# Patient Record
Sex: Female | Born: 1975 | Hispanic: No | Marital: Married | Smoking: Never smoker
Health system: Southern US, Community
[De-identification: ages and names within clinical notes are randomized; demographics above are authoritative.]

## PROBLEM LIST (undated history)

## (undated) ENCOUNTER — Inpatient Hospital Stay (HOSPITAL_COMMUNITY): Payer: Self-pay

## (undated) DIAGNOSIS — Z789 Other specified health status: Secondary | ICD-10-CM

## (undated) DIAGNOSIS — J302 Other seasonal allergic rhinitis: Secondary | ICD-10-CM

## (undated) HISTORY — PX: NO PAST SURGERIES: SHX2092

## (undated) HISTORY — DX: Other seasonal allergic rhinitis: J30.2

---

## 2002-04-29 ENCOUNTER — Inpatient Hospital Stay (HOSPITAL_COMMUNITY): Admission: AD | Admit: 2002-04-29 | Discharge: 2002-04-29 | Payer: Self-pay | Admitting: Obstetrics and Gynecology

## 2002-06-09 ENCOUNTER — Other Ambulatory Visit: Admission: RE | Admit: 2002-06-09 | Discharge: 2002-06-09 | Payer: Self-pay | Admitting: Obstetrics and Gynecology

## 2002-11-18 ENCOUNTER — Encounter (HOSPITAL_COMMUNITY): Admission: RE | Admit: 2002-11-18 | Discharge: 2002-11-22 | Payer: Self-pay | Admitting: *Deleted

## 2002-11-25 ENCOUNTER — Inpatient Hospital Stay (HOSPITAL_COMMUNITY): Admission: AD | Admit: 2002-11-25 | Discharge: 2002-11-27 | Payer: Self-pay | Admitting: *Deleted

## 2002-11-28 ENCOUNTER — Encounter: Admission: RE | Admit: 2002-11-28 | Discharge: 2002-12-28 | Payer: Self-pay | Admitting: *Deleted

## 2012-07-29 ENCOUNTER — Other Ambulatory Visit (HOSPITAL_COMMUNITY): Payer: Self-pay | Admitting: Obstetrics

## 2012-07-29 DIAGNOSIS — O09529 Supervision of elderly multigravida, unspecified trimester: Secondary | ICD-10-CM

## 2012-07-29 DIAGNOSIS — Z3689 Encounter for other specified antenatal screening: Secondary | ICD-10-CM

## 2012-08-10 ENCOUNTER — Encounter (HOSPITAL_COMMUNITY): Payer: Self-pay | Admitting: Obstetrics

## 2012-08-10 LAB — OB RESULTS CONSOLE RUBELLA ANTIBODY, IGM: Rubella: IMMUNE

## 2012-08-10 LAB — OB RESULTS CONSOLE HIV ANTIBODY (ROUTINE TESTING): HIV: NONREACTIVE

## 2012-08-10 LAB — OB RESULTS CONSOLE GC/CHLAMYDIA: Chlamydia: NEGATIVE

## 2012-08-10 LAB — OB RESULTS CONSOLE RPR: RPR: NONREACTIVE

## 2012-08-10 LAB — OB RESULTS CONSOLE ANTIBODY SCREEN: Antibody Screen: NEGATIVE

## 2012-08-10 LAB — OB RESULTS CONSOLE HEPATITIS B SURFACE ANTIGEN: Hepatitis B Surface Ag: NEGATIVE

## 2012-08-17 ENCOUNTER — Ambulatory Visit (HOSPITAL_COMMUNITY): Payer: Self-pay

## 2012-08-24 ENCOUNTER — Ambulatory Visit (HOSPITAL_COMMUNITY): Admission: RE | Admit: 2012-08-24 | Payer: Self-pay | Source: Ambulatory Visit

## 2012-08-24 ENCOUNTER — Ambulatory Visit (HOSPITAL_COMMUNITY)
Admission: RE | Admit: 2012-08-24 | Discharge: 2012-08-24 | Disposition: A | Discharge status: Home / Self Care | Payer: Self-pay | Source: Ambulatory Visit | Attending: Obstetrics | Admitting: Obstetrics

## 2012-08-24 ENCOUNTER — Encounter (HOSPITAL_COMMUNITY): Payer: Self-pay

## 2012-08-24 DIAGNOSIS — Z3689 Encounter for other specified antenatal screening: Secondary | ICD-10-CM | POA: Insufficient documentation

## 2012-08-24 DIAGNOSIS — O09529 Supervision of elderly multigravida, unspecified trimester: Secondary | ICD-10-CM | POA: Insufficient documentation

## 2012-08-25 ENCOUNTER — Other Ambulatory Visit: Payer: Self-pay

## 2012-09-29 NOTE — L&D Delivery Note (Signed)
Delivery Note At 9:33 PM a viable female was delivered via Vaginal, Spontaneous Delivery (Presentation: ;  ).  APGAR: , ; weight .   Placenta status: , .  Cord:  with the following complications: .  Cord pH: not done  Anesthesia:   Episiotomy:  Lacerations:  Suture Repair: 2.0 Est. Blood Loss (mL):   Mom to postpartum.  Baby to nursery-stable.  Rabon Scholle A 01/05/2013, 9:46 PM

## 2012-10-30 ENCOUNTER — Emergency Department (HOSPITAL_COMMUNITY)
Admission: EM | Admit: 2012-10-30 | Discharge: 2012-10-30 | Disposition: A | Discharge status: Home / Self Care | Payer: No Typology Code available for payment source | Attending: Emergency Medicine | Admitting: Emergency Medicine

## 2012-10-30 ENCOUNTER — Encounter (HOSPITAL_COMMUNITY): Payer: Self-pay | Admitting: Emergency Medicine

## 2012-10-30 DIAGNOSIS — Z043 Encounter for examination and observation following other accident: Secondary | ICD-10-CM | POA: Insufficient documentation

## 2012-10-30 DIAGNOSIS — O9989 Other specified diseases and conditions complicating pregnancy, childbirth and the puerperium: Secondary | ICD-10-CM | POA: Insufficient documentation

## 2012-10-30 DIAGNOSIS — Y9389 Activity, other specified: Secondary | ICD-10-CM | POA: Insufficient documentation

## 2012-10-30 DIAGNOSIS — Y92410 Unspecified street and highway as the place of occurrence of the external cause: Secondary | ICD-10-CM | POA: Insufficient documentation

## 2012-10-30 DIAGNOSIS — Z3490 Encounter for supervision of normal pregnancy, unspecified, unspecified trimester: Secondary | ICD-10-CM

## 2012-10-30 MED ORDER — SODIUM CHLORIDE 0.9 % IV BOLUS (SEPSIS)
500.0000 mL | Freq: Once | INTRAVENOUS | Status: AC
  Administered 2012-10-30: 21:00:00 via INTRAVENOUS

## 2012-10-30 MED ORDER — SODIUM CHLORIDE 0.9 % IV SOLN
INTRAVENOUS | Status: DC
  Administered 2012-10-30: 21:00:00 via INTRAVENOUS
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
## 2012-12-09 ENCOUNTER — Inpatient Hospital Stay (HOSPITAL_COMMUNITY)
Admission: AD | Admit: 2012-12-09 | Discharge: 2012-12-10 | Disposition: A | Discharge status: Home / Self Care | Payer: Self-pay | Source: Ambulatory Visit | Attending: Obstetrics | Admitting: Obstetrics

## 2012-12-09 ENCOUNTER — Encounter (HOSPITAL_COMMUNITY): Payer: Self-pay | Admitting: *Deleted

## 2012-12-09 DIAGNOSIS — O4700 False labor before 37 completed weeks of gestation, unspecified trimester: Secondary | ICD-10-CM | POA: Insufficient documentation

## 2012-12-09 HISTORY — DX: Other specified health status: Z78.9

## 2012-12-09 NOTE — MAU Note (Signed)
Pt states she started having contractions in the morning, contractions seem to have become closer together, and they hurt a little more than they did this morning.

## 2012-12-09 NOTE — MAU Note (Signed)
Pt states has been having contracitons all evening-states baby is not moving much and wants to make sure everything is OK

## 2012-12-10 MED ORDER — LACTATED RINGERS IV BOLUS (SEPSIS)
1000.0000 mL | Freq: Once | INTRAVENOUS | Status: AC
  Administered 2012-12-10: 1000 mL via INTRAVENOUS

## 2012-12-10 MED ORDER — TERBUTALINE SULFATE 1 MG/ML IJ SOLN
0.2500 mg | Freq: Once | INTRAMUSCULAR | Status: AC
  Administered 2012-12-10: 0.25 mg via SUBCUTANEOUS
  Filled 2012-12-10: qty 1

## 2012-12-10 NOTE — MAU Provider Note (Signed)
  History     CSN: [id number]  Arrival date and time: 12/09/12 2221   First Provider Initiated Contact with Patient 12/10/12 0033      Chief Complaint  Patient presents with  . Contractions   HPI  Pt is [redacted]w[redacted]d G4P2013 pregnant and presents with contractions.  Pt states she has had ctx all day.  She denies leakage of fluid or bleeding..  Pt denies UTI symptoms, fever, chills, constipation or diarrhea.  Pt denies complications with this pregnancy or prior pregnancies  Past Medical History  Diagnosis Date  . Medical history non-contributory     Past Surgical History  Procedure Laterality Date  . No past surgeries      Family History  Problem Relation Age of Onset  . Alcohol abuse Neg Hx   . Arthritis Neg Hx   . Asthma Neg Hx   . Birth defects Neg Hx   . Cancer Neg Hx   . COPD Neg Hx   . Depression Neg Hx   . Diabetes Neg Hx   . Drug abuse Neg Hx   . Early death Neg Hx   . Hearing loss Neg Hx   . Heart disease Neg Hx   . Hyperlipidemia Neg Hx   . Hypertension Neg Hx   . Kidney disease Neg Hx   . Learning disabilities Neg Hx   . Mental illness Neg Hx   . Mental retardation Neg Hx   . Miscarriages / Stillbirths Neg Hx   . Stroke Neg Hx   . Vision loss Neg Hx     History  Substance Use Topics  . Smoking status: Never Smoker   . Smokeless tobacco: Not on file  . Alcohol Use: No    Allergies: No Known Allergies  Prescriptions prior to admission  Medication Sig Dispense Refill  . Prenatal Vit-Fe Fumarate-FA (PRENATAL MULTIVITAMIN) TABS Take 1 tablet by mouth daily.        Review of Systems  Constitutional: Negative for fever and chills.  Gastrointestinal: Positive for abdominal pain. Negative for nausea, vomiting, diarrhea and constipation.  Genitourinary: Negative for dysuria.  Neurological: Negative for headaches.   Physical Exam   Blood pressure 110/70, pulse 120, temperature 98.9 F (37.2 C), temperature source Oral, resp. rate 20, height 5\' 1"   (1.549 m), weight 174 lb (78.926 kg), last menstrual period 04/11/2012, SpO2 100.00%.  Physical Exam  Nursing note and vitals reviewed. Constitutional: She is oriented to person, place, and time. She appears well-developed and well-nourished. No distress.  HENT:  Head: Normocephalic.  Eyes: Pupils are equal, round, and reactive to light.  Neck: Normal range of motion. Neck supple.  Cardiovascular: Normal rate.   Respiratory: Effort normal.  GI:  Ctx every 2 to 4 minutes; FHR reactive  Genitourinary:  Cervix FT ~50% effaced, high  Musculoskeletal: Normal range of motion.  Neurological: She is alert and oriented to person, place, and time.  Skin: Skin is warm and dry.  Psychiatric: She has a normal mood and affect.    MAU Course  Procedures Ctx every 2 to 4 minutes- discussed with Dr. Gaynell Face 1000ccLR with Terbutaline0.25mg   SQ given with occ ctx FHR reactive Pt discharged home to F/U with Dr. Gaynell Face as scheduled- sooner if increase in symptoms  Assessment and Plan  Threatened preterm labor Pelvic rest until appt with Dr. Ellin Goodie 12/10/2012, 12:34 AM

## 2013-01-05 ENCOUNTER — Encounter (HOSPITAL_COMMUNITY): Payer: Self-pay

## 2013-01-05 ENCOUNTER — Inpatient Hospital Stay (HOSPITAL_COMMUNITY)
Admission: AD | Admit: 2013-01-05 | Discharge: 2013-01-07 | DRG: 775 | Disposition: A | Discharge status: Home / Self Care | Payer: Medicaid Other | Source: Ambulatory Visit | Attending: Obstetrics | Admitting: Obstetrics

## 2013-01-05 DIAGNOSIS — Z22330 Carrier of Group B streptococcus: Secondary | ICD-10-CM

## 2013-01-05 DIAGNOSIS — O99892 Other specified diseases and conditions complicating childbirth: Principal | ICD-10-CM | POA: Diagnosis present

## 2013-01-05 DIAGNOSIS — O09529 Supervision of elderly multigravida, unspecified trimester: Secondary | ICD-10-CM | POA: Diagnosis present

## 2013-01-05 LAB — TYPE AND SCREEN
ABO/RH(D): O POS
Antibody Screen: NEGATIVE

## 2013-01-05 LAB — CBC
Platelets: 188 10*3/uL (ref 150–400)
RBC: 3.7 MIL/uL — ABNORMAL LOW (ref 3.87–5.11)
RDW: 13.2 % (ref 11.5–15.5)
WBC: 9.8 10*3/uL (ref 4.0–10.5)

## 2013-01-05 MED ORDER — SENNOSIDES-DOCUSATE SODIUM 8.6-50 MG PO TABS
2.0000 | ORAL_TABLET | Freq: Every day | ORAL | Status: DC
  Administered 2013-01-06: 2 via ORAL

## 2013-01-05 MED ORDER — TETANUS-DIPHTH-ACELL PERTUSSIS 5-2.5-18.5 LF-MCG/0.5 IM SUSP
0.5000 mL | Freq: Once | INTRAMUSCULAR | Status: AC
  Administered 2013-01-06: 0.5 mL via INTRAMUSCULAR
  Filled 2013-01-05 (×2): qty 0.5

## 2013-01-05 MED ORDER — EPHEDRINE 5 MG/ML INJ
10.0000 mg | INTRAVENOUS | Status: DC | PRN
  Filled 2013-01-05: qty 2

## 2013-01-05 MED ORDER — IBUPROFEN 600 MG PO TABS
600.0000 mg | ORAL_TABLET | Freq: Four times a day (QID) | ORAL | Status: DC | PRN
  Administered 2013-01-06: 600 mg via ORAL
  Filled 2013-01-05 (×7): qty 1

## 2013-01-05 MED ORDER — OXYTOCIN BOLUS FROM INFUSION
500.0000 mL | INTRAVENOUS | Status: DC
  Administered 2013-01-05: 500 mL via INTRAVENOUS

## 2013-01-05 MED ORDER — DIPHENHYDRAMINE HCL 25 MG PO CAPS: 25.0000 mg | ORAL_CAPSULE | Freq: Four times a day (QID) | ORAL | Status: DC | PRN

## 2013-01-05 MED ORDER — SIMETHICONE 80 MG PO CHEW: 80.0000 mg | CHEWABLE_TABLET | ORAL | Status: DC | PRN

## 2013-01-05 MED ORDER — ONDANSETRON HCL 4 MG/2ML IJ SOLN: 4.0000 mg | Freq: Four times a day (QID) | INTRAMUSCULAR | Status: DC | PRN

## 2013-01-05 MED ORDER — ONDANSETRON HCL 4 MG PO TABS: 4.0000 mg | ORAL_TABLET | ORAL | Status: DC | PRN

## 2013-01-05 MED ORDER — LACTATED RINGERS IV SOLN: 500.0000 mL | INTRAVENOUS | Status: DC | PRN

## 2013-01-05 MED ORDER — BENZOCAINE-MENTHOL 20-0.5 % EX AERO: 1.0000 "application " | INHALATION_SPRAY | CUTANEOUS | Status: DC | PRN

## 2013-01-05 MED ORDER — PRENATAL MULTIVITAMIN CH
1.0000 | ORAL_TABLET | Freq: Every day | ORAL | Status: DC
  Administered 2013-01-06 – 2013-01-07 (×2): 1 via ORAL
  Filled 2013-01-05: qty 1

## 2013-01-05 MED ORDER — FERROUS SULFATE 325 (65 FE) MG PO TABS
325.0000 mg | ORAL_TABLET | Freq: Two times a day (BID) | ORAL | Status: DC
  Administered 2013-01-06 – 2013-01-07 (×4): 325 mg via ORAL
  Filled 2013-01-05 (×4): qty 1

## 2013-01-05 MED ORDER — WITCH HAZEL-GLYCERIN EX PADS: 1.0000 "application " | MEDICATED_PAD | CUTANEOUS | Status: DC | PRN

## 2013-01-05 MED ORDER — DIPHENHYDRAMINE HCL 50 MG/ML IJ SOLN: 12.5000 mg | INTRAMUSCULAR | Status: DC | PRN

## 2013-01-05 MED ORDER — PHENYLEPHRINE 40 MCG/ML (10ML) SYRINGE FOR IV PUSH (FOR BLOOD PRESSURE SUPPORT)
80.0000 ug | PREFILLED_SYRINGE | INTRAVENOUS | Status: DC | PRN
  Filled 2013-01-05: qty 2
  Filled 2013-01-05: qty 5

## 2013-01-05 MED ORDER — IBUPROFEN 600 MG PO TABS
600.0000 mg | ORAL_TABLET | Freq: Four times a day (QID) | ORAL | Status: DC
  Administered 2013-01-06 – 2013-01-07 (×7): 600 mg via ORAL

## 2013-01-05 MED ORDER — EPHEDRINE 5 MG/ML INJ
10.0000 mg | INTRAVENOUS | Status: DC | PRN
  Filled 2013-01-05: qty 2
  Filled 2013-01-05: qty 4

## 2013-01-05 MED ORDER — OXYCODONE-ACETAMINOPHEN 5-325 MG PO TABS: 1.0000 | ORAL_TABLET | ORAL | Status: DC | PRN

## 2013-01-05 MED ORDER — FENTANYL 2.5 MCG/ML BUPIVACAINE 1/10 % EPIDURAL INFUSION (WH - ANES)
14.0000 mL/h | INTRAMUSCULAR | Status: DC | PRN
  Filled 2013-01-05: qty 125

## 2013-01-05 MED ORDER — OXYTOCIN 40 UNITS IN LACTATED RINGERS INFUSION - SIMPLE MED
62.5000 mL/h | INTRAVENOUS | Status: DC
  Filled 2013-01-05: qty 1000

## 2013-01-05 MED ORDER — BUTORPHANOL TARTRATE 1 MG/ML IJ SOLN: 1.0000 mg | INTRAMUSCULAR | Status: DC | PRN

## 2013-01-05 MED ORDER — LACTATED RINGERS IV SOLN
INTRAVENOUS | Status: DC
  Administered 2013-01-05: 21:00:00 via INTRAVENOUS

## 2013-01-05 MED ORDER — CITRIC ACID-SODIUM CITRATE 334-500 MG/5ML PO SOLN: 30.0000 mL | ORAL | Status: DC | PRN

## 2013-01-05 MED ORDER — ONDANSETRON HCL 4 MG/2ML IJ SOLN: 4.0000 mg | INTRAMUSCULAR | Status: DC | PRN

## 2013-01-05 MED ORDER — PHENYLEPHRINE 40 MCG/ML (10ML) SYRINGE FOR IV PUSH (FOR BLOOD PRESSURE SUPPORT)
80.0000 ug | PREFILLED_SYRINGE | INTRAVENOUS | Status: DC | PRN
  Filled 2013-01-05: qty 2

## 2013-01-05 MED ORDER — ACETAMINOPHEN 325 MG PO TABS: 650.0000 mg | ORAL_TABLET | ORAL | Status: DC | PRN

## 2013-01-05 MED ORDER — DIBUCAINE 1 % RE OINT: 1.0000 "application " | TOPICAL_OINTMENT | RECTAL | Status: DC | PRN

## 2013-01-05 MED ORDER — LIDOCAINE HCL (PF) 1 % IJ SOLN
30.0000 mL | INTRAMUSCULAR | Status: DC | PRN
  Filled 2013-01-05 (×2): qty 30

## 2013-01-05 MED ORDER — ZOLPIDEM TARTRATE 5 MG PO TABS: 5.0000 mg | ORAL_TABLET | Freq: Every evening | ORAL | Status: DC | PRN

## 2013-01-05 MED ORDER — LANOLIN HYDROUS EX OINT: TOPICAL_OINTMENT | CUTANEOUS | Status: DC | PRN

## 2013-01-05 MED ORDER — FLEET ENEMA 7-19 GM/118ML RE ENEM: 1.0000 | ENEMA | RECTAL | Status: DC | PRN

## 2013-01-05 MED ORDER — SODIUM CHLORIDE 0.9 % IV SOLN
2.0000 g | Freq: Once | INTRAVENOUS | Status: AC
  Administered 2013-01-05: 2 g via INTRAVENOUS
  Filled 2013-01-05: qty 2000

## 2013-01-05 MED ORDER — LACTATED RINGERS IV SOLN
500.0000 mL | Freq: Once | INTRAVENOUS | Status: AC
  Administered 2013-01-05: 500 mL via INTRAVENOUS

## 2013-01-05 NOTE — H&P (Signed)
This is Dr. Francoise Ceo dictating the history and physical on  Martha Tran  she's a 37 year old gravida 4 para 2013 at 70 weeks and 3 days EDC 01/16/2013 positive GBS for which she got ampicillin 2 g IV she was admitted 7 cm dilated in labor 100% vertex -1 him Mr. Rapidly became fully dilated amniotomy performed fluid clear showed normal vaginal delivery from the LOA position of a female Apgar 54 placenta was spontaneous intact and there was no episiotomy or laceration note Past medical history she had a blood transfusion in [place] after delivering twins many years ago Past surgical history negative Social history negative System review negative Physical revealed a well-developed female in labor HEENT negative Breasts negative Lungs clear to P&A Heart regular rhythm no murmurs no gallops Abdomen term Pelvic as described above Extremities negative

## 2013-01-05 NOTE — MAU Note (Signed)
Patient brought straight from car with c/o intense contractions. Martha Tran was transferred straight to rm 162 via wheelchair. Accompanied by shimica, rn and patient's husband. G5p4. Denies lof.

## 2013-01-06 LAB — CBC
Hemoglobin: 10.8 g/dL — ABNORMAL LOW (ref 12.0–15.0)
MCH: 32.3 pg (ref 26.0–34.0)
MCHC: 33.8 g/dL (ref 30.0–36.0)
MCV: 95.8 fL (ref 78.0–100.0)
RBC: 3.34 MIL/uL — ABNORMAL LOW (ref 3.87–5.11)

## 2013-01-06 LAB — ABO/RH: ABO/RH(D): O POS

## 2013-01-06 NOTE — Progress Notes (Signed)
UR chart review completed.  

## 2013-01-06 NOTE — Progress Notes (Signed)
Patient ID: Martha Tran, female   DOB: April 23, 1976, 37 y.o.   MRN: [id number] Postpartum day one Vital signs normal Fundus firm Lochia moderate Legs negative Doing well

## 2013-01-07 NOTE — Discharge Summary (Signed)
Obstetric Discharge Summary Reason for Admission: onset of labor Prenatal Procedures: none Intrapartum Procedures: spontaneous vaginal delivery Postpartum Procedures: none Complications-Operative and Postpartum: none Hemoglobin  Date Value Range Status  01/06/2013 10.8* 12.0 - 15.0 g/dL Final     HCT  Date Value Range Status  01/06/2013 32.0* 36.0 - 46.0 % Final    Physical Exam:  General: alert Lochia: appropriate Uterine Fundus: firm Incision: healing well DVT Evaluation: No evidence of DVT seen on physical exam.  Discharge Diagnoses: Term Pregnancy-delivered  Discharge Information: Date: 01/07/2013 Activity: pelvic rest Diet: routine Medications: Percocet Condition: stable Instructions: refer to practice specific booklet Discharge to: home Follow-up Information   Follow up with MARSHALL,BERNARD A, MD. Schedule an appointment as soon as possible for a visit in 6 weeks.   Contact information:   [address] [place] [address] [phone]       Newborn Data: Live born female  Birth Weight: 7 lb 4 oz (3289 g) APGAR: 9, 9  Home with mother.  MARSHALL,BERNARD A 01/07/2013, 7:01 AM

## 2014-07-31 ENCOUNTER — Encounter (HOSPITAL_COMMUNITY): Payer: Self-pay

## 2018-06-29 DIAGNOSIS — R877 Abnormal histological findings in specimens from female genital organs: Secondary | ICD-10-CM

## 2018-06-29 HISTORY — DX: Abnormal histological findings in specimens from female genital organs: R87.7

## 2018-07-21 ENCOUNTER — Encounter (HOSPITAL_COMMUNITY): Payer: Self-pay | Admitting: *Deleted

## 2018-07-21 ENCOUNTER — Other Ambulatory Visit (HOSPITAL_COMMUNITY): Payer: Self-pay | Admitting: *Deleted

## 2018-07-21 DIAGNOSIS — Z1231 Encounter for screening mammogram for malignant neoplasm of breast: Secondary | ICD-10-CM

## 2018-08-24 ENCOUNTER — Ambulatory Visit
Admission: RE | Admit: 2018-08-24 | Discharge: 2018-08-24 | Disposition: A | Discharge status: Home / Self Care | Payer: Self-pay | Source: Ambulatory Visit | Attending: Obstetrics and Gynecology | Admitting: Obstetrics and Gynecology

## 2018-08-24 ENCOUNTER — Other Ambulatory Visit (HOSPITAL_COMMUNITY): Payer: Self-pay | Admitting: Obstetrics and Gynecology

## 2018-08-24 ENCOUNTER — Ambulatory Visit (HOSPITAL_COMMUNITY)
Admission: RE | Admit: 2018-08-24 | Discharge: 2018-08-24 | Disposition: A | Discharge status: Home / Self Care | Payer: Self-pay | Source: Ambulatory Visit | Attending: Obstetrics and Gynecology | Admitting: Obstetrics and Gynecology

## 2018-08-24 ENCOUNTER — Ambulatory Visit
Admission: RE | Admit: 2018-08-24 | Discharge: 2018-08-24 | Disposition: A | Discharge status: Home / Self Care | Payer: PRIVATE HEALTH INSURANCE | Source: Ambulatory Visit | Attending: Obstetrics and Gynecology | Admitting: Obstetrics and Gynecology

## 2018-08-24 ENCOUNTER — Encounter (HOSPITAL_COMMUNITY): Payer: Self-pay

## 2018-08-24 ENCOUNTER — Ambulatory Visit: Payer: Self-pay

## 2018-08-24 VITALS — BP 102/64 | Wt 158.0 lb

## 2018-08-24 DIAGNOSIS — IMO0002 Reserved for concepts with insufficient information to code with codable children: Secondary | ICD-10-CM

## 2018-08-24 DIAGNOSIS — Z1239 Encounter for other screening for malignant neoplasm of breast: Secondary | ICD-10-CM

## 2018-08-24 DIAGNOSIS — R87610 Atypical squamous cells of undetermined significance on cytologic smear of cervix (ASC-US): Secondary | ICD-10-CM

## 2018-08-24 DIAGNOSIS — R2232 Localized swelling, mass and lump, left upper limb: Secondary | ICD-10-CM

## 2018-08-24 DIAGNOSIS — R229 Localized swelling, mass and lump, unspecified: Principal | ICD-10-CM

## 2018-08-24 DIAGNOSIS — R87810 Cervical high risk human papillomavirus (HPV) DNA test positive: Secondary | ICD-10-CM

## 2018-08-24 NOTE — Progress Notes (Signed)
[facility] due to having an abnormal Pap smear on 07/14/2018 that a colposcopy is recommended for follow-up.  Pap Smear: Pap smear not completed today. Last Pap [facility] and ASCUS with positive HPV. Referred patient to the Center [facility] for a colposcopy to follow-up for her abnormal Pap smear. Appointment scheduled for Wednesday, September 01, 2018 at 0855. Per patient has no history of an abnormal Pap smear prior to her most recent Pap smear. Last Pap smear result is in Epic.  Physical exam: Breasts Breasts symmetrical. No skin abnormalities bilateral breasts. No nipple retraction bilateral breasts. No nipple discharge bilateral breasts. No lymphadenopathy. No lumps palpated right breast. Palpated a lump within the left axilla at 2 o'clock 15 cm from the nipple. No complaints of pain or tenderness on exam. [facility] for a diagnostic mammogram and left breast breast ultrasound. Appointment scheduled for Tuesday, August 24, 2018 at 1400.        Pelvic/Bimanual No Pap smear completed today since last Pap smear and HPV typing was 07/14/2018. Pap smear not indicated per BCCCP guidelines.   Smoking History: Patient has never smoked.  Patient Navigation: Patient education provided. Access to services provided for patient through [facility] program. Spanish interpreter provided.   Breast and Cervical Cancer Risk Assessment: Patient has no family history of breast cancer, known genetic mutations, or radiation treatment to the chest before age 42. Patient has no history of cervical dysplasia, immunocompromised, or DES exposure in-utero.  Risk Assessment    Risk Scores      08/24/2018   Last edited by: Lynnell DikeHolland, Sabrina H, LPN   5-year risk: 0.4 %   Lifetime risk: 5 %         Used Spanish interpreter Natale LayErika McReynolds  from [facility].

## 2018-08-24 NOTE — Patient Instructions (Signed)
Explained breast self awareness with Martha Tran. Patient did not need a Pap smear today due to last Pap smear was 07/14/2018. Explained the colposcopy the recommended follow-up for her abnormal Pap smear. [facility] for a colposcopy to follow-up for her abnormal Pap smear. Appointment scheduled for Wednesday, September 01, 2018 at 0855. [facility] for a diagnostic mammogram and left breast breast ultrasound. Appointment scheduled for Tuesday, August 24, 2018 at 1400. Patient aware of appointments and will be there. [name] verbalized understanding.  Viggo Perko, Kathaleen Maserhristine Poll, RN 2:38 PM

## 2018-08-30 ENCOUNTER — Encounter (HOSPITAL_COMMUNITY): Payer: Self-pay | Admitting: *Deleted

## 2018-09-01 ENCOUNTER — Other Ambulatory Visit (HOSPITAL_COMMUNITY)
Admission: RE | Admit: 2018-09-01 | Discharge: 2018-09-01 | Disposition: A | Discharge status: Home / Self Care | Payer: PRIVATE HEALTH INSURANCE | Source: Ambulatory Visit | Attending: Obstetrics & Gynecology | Admitting: Obstetrics & Gynecology

## 2018-09-01 ENCOUNTER — Ambulatory Visit (INDEPENDENT_AMBULATORY_CARE_PROVIDER_SITE_OTHER): Payer: Self-pay | Admitting: Obstetrics & Gynecology

## 2018-09-01 ENCOUNTER — Encounter: Payer: Self-pay | Admitting: Obstetrics & Gynecology

## 2018-09-01 VITALS — BP 112/68 | HR 79 | Wt 159.9 lb

## 2018-09-01 DIAGNOSIS — R87610 Atypical squamous cells of undetermined significance on cytologic smear of cervix (ASC-US): Secondary | ICD-10-CM | POA: Insufficient documentation

## 2018-09-01 DIAGNOSIS — R87810 Cervical high risk human papillomavirus (HPV) DNA test positive: Secondary | ICD-10-CM

## 2018-09-01 LAB — POCT PREGNANCY, URINE: Preg Test, Ur: NEGATIVE

## 2018-09-01 NOTE — Progress Notes (Signed)
   Subjective:    Patient ID: Martha Tran, female    DOB: 01/04/1976, 42 y.o.   MRN: [id number]  HPI 42 yo Hispanic P3 here for a colpo due to a ASCUS + HPV 6/7 [facility].    Review of Systems She uses an IUD for contraception.    Objective:   Physical Exam Breathing, conversing, and ambulating normally Well nourished, well hydrated Latina, no apparent distress  Live interpretor used for the exam. UPT negative, consent signed, time out done Cervix prepped with acetic acid. IUD strings seen, about 1.5 cm long Transformation zone seen in its entirety. Colpo adequate. 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 ECC obtained. She tolerated the procedure well.     Assessment & Plan:  ASCUS + HR HPV, colpo c/w LGSIL- await pathology I told her that someone would call with results

## 2018-09-01 NOTE — Addendum Note (Signed)
Addended by: Allie BossierVE, Amulya Quintin C on: 09/01/2018 09:27 AM   Modules accepted: Orders

## 2018-09-01 NOTE — Addendum Note (Signed)
Addended by: Allie BossierVE, Duffy Dantonio C on: 09/01/2018 09:29 AM   Modules accepted: Orders

## 2018-09-20 ENCOUNTER — Telehealth: Payer: Self-pay | Admitting: *Deleted

## 2018-09-20 NOTE — Telephone Encounter (Signed)
-----   Message from Allie BossierMyra C Dove, MD sent at 09/14/2018  8:22 AM EST ----- She will need a pap with cotesting in a year. Thanks ----- Message ----- From: Kathee DeltonHillman, Carrie L, RN Sent: 09/13/2018   2:34 PM EST To: Allie BossierMyra C Dove, MD  Please review colposcopy results & advise clinical pool. Thanks!

## 2018-09-20 NOTE — Telephone Encounter (Signed)
Interpreter [phone] used to call pt to inform her that her colpo showed LGSIL and that she needs to repeat her pap in 1 year with HPV testing.  Pt verbalized understanding.

## 2020-09-16 IMAGING — MG DIGITAL DIAGNOSTIC BILATERAL MAMMOGRAM WITH TOMO AND CAD
6 of 10 series · 6 of 30 positions shown · non-contrast
Comparison: None

CLINICAL DATA: Patient presents for palpable abnormality within the
left axilla.

EXAM:
DIGITAL DIAGNOSTIC BILATERAL MAMMOGRAM WITH CAD AND TOMO
ULTRASOUND LEFT BREAST

[R MLO synth-2D]
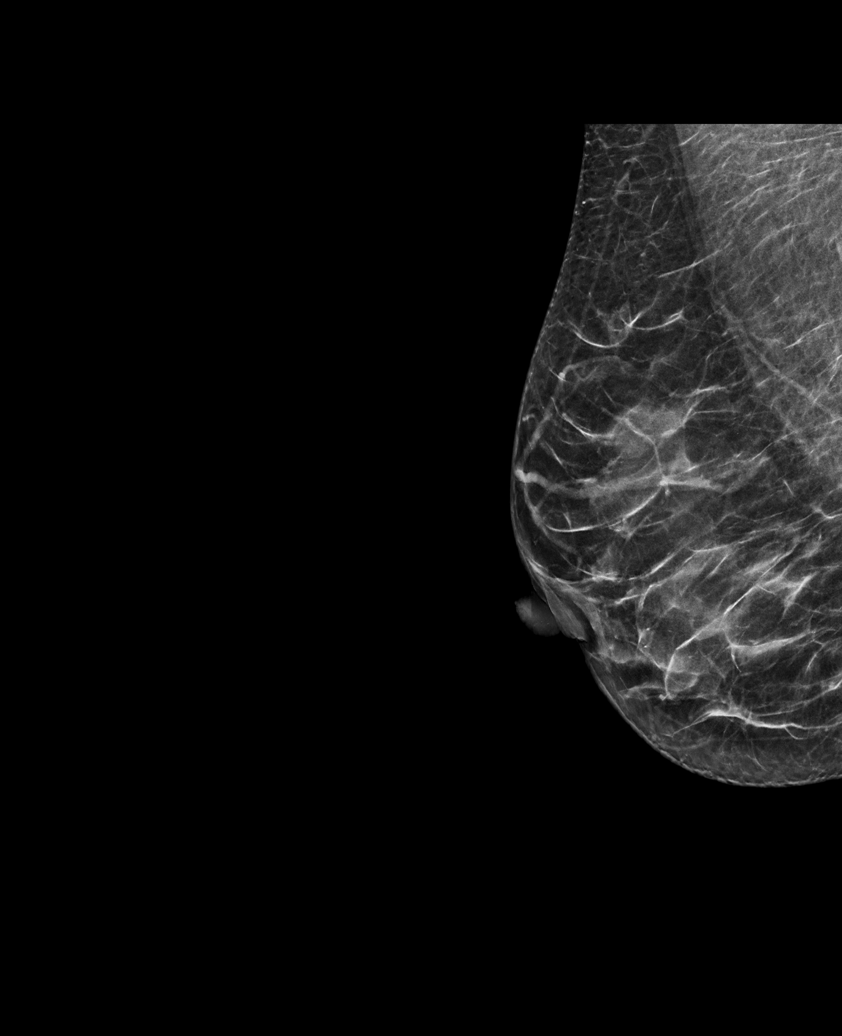

[L TAN synth-2D]
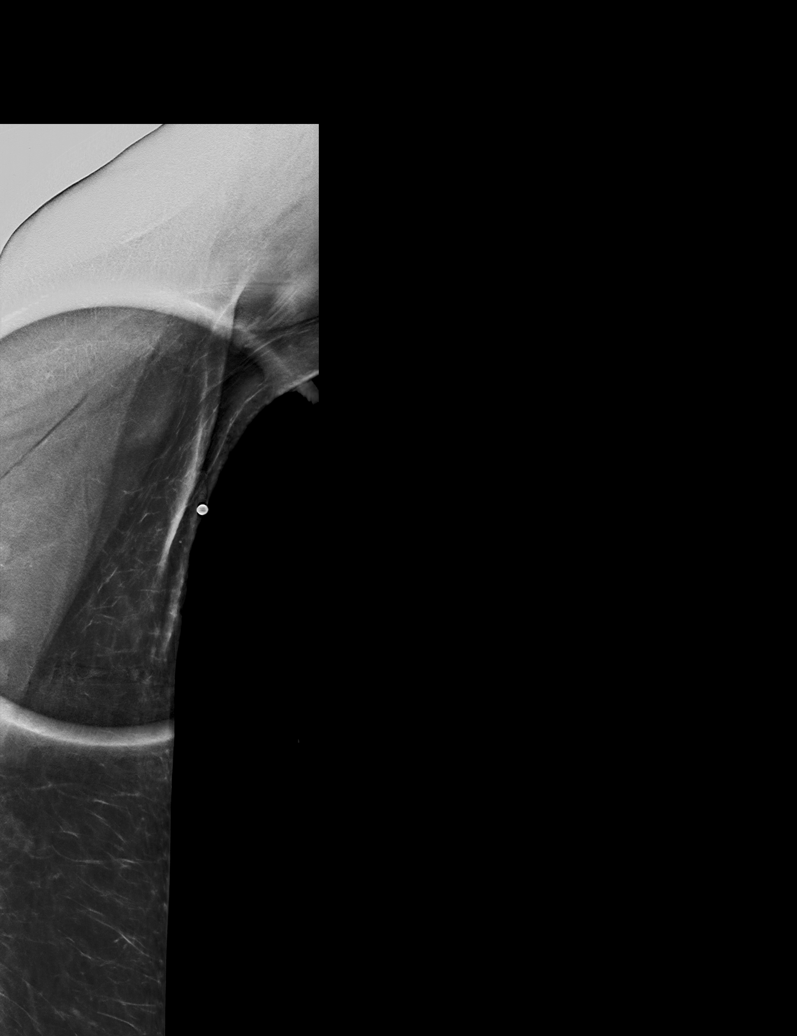

[L MLO synth-2D]
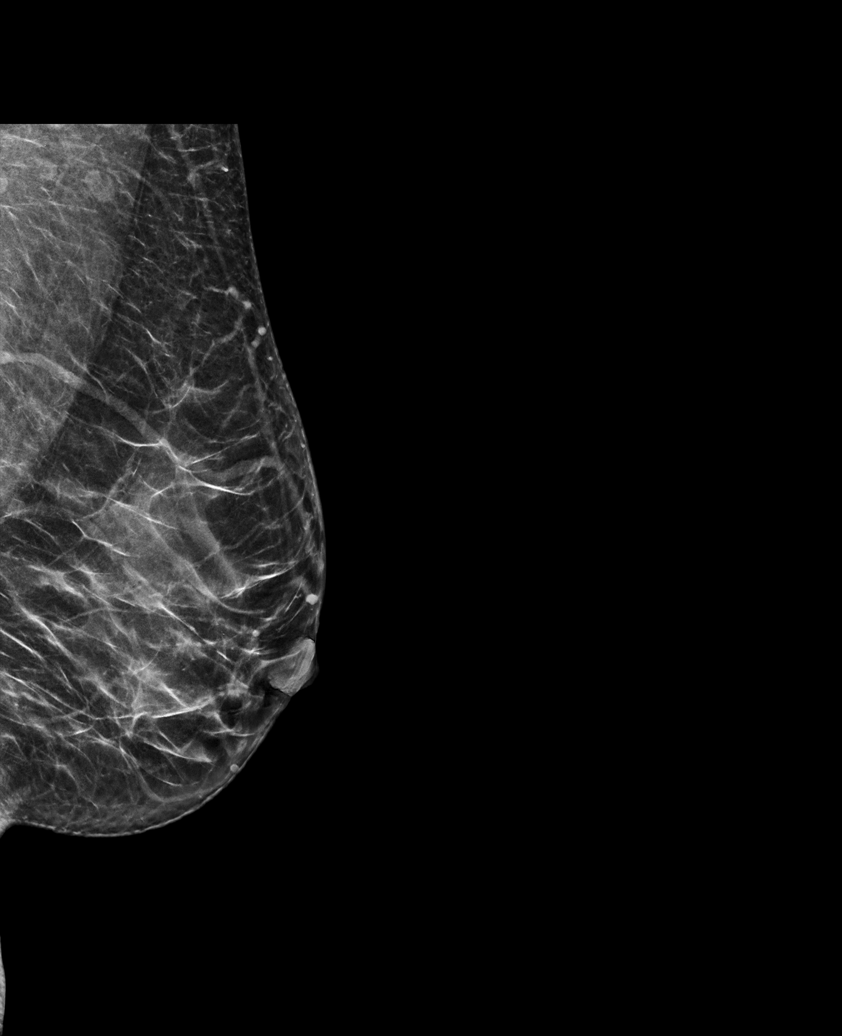

[R CC synth-2D]
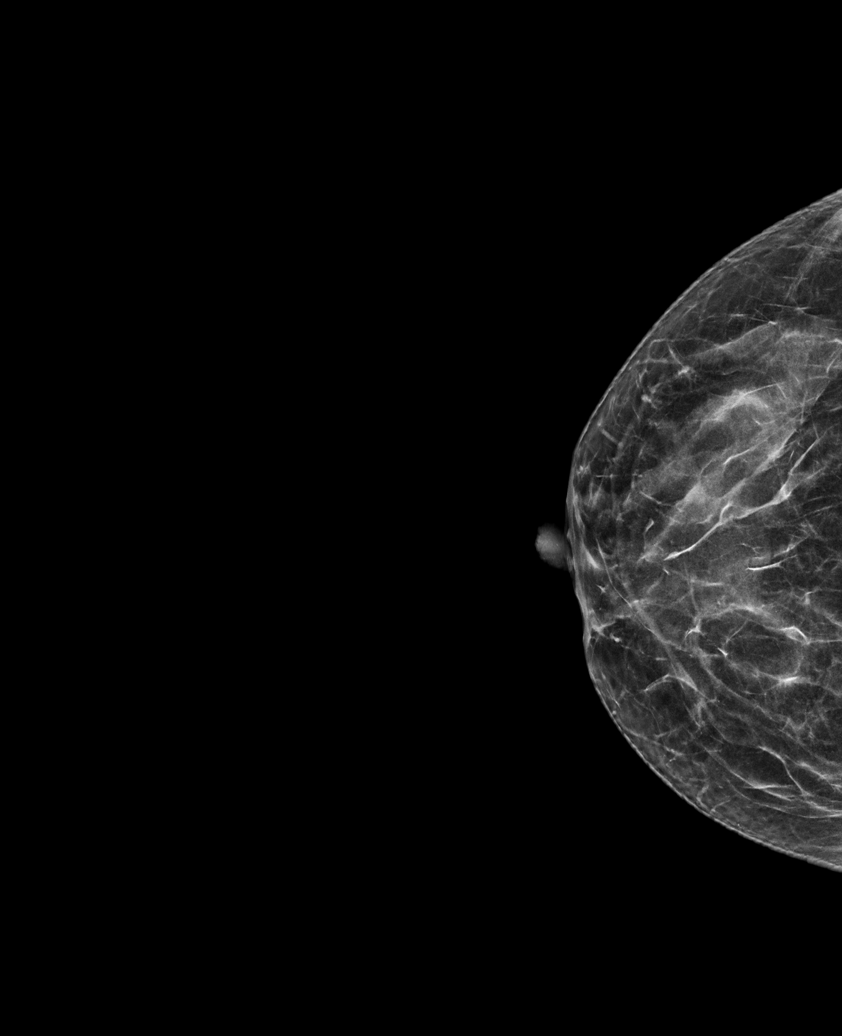

[L CC synth-2D]
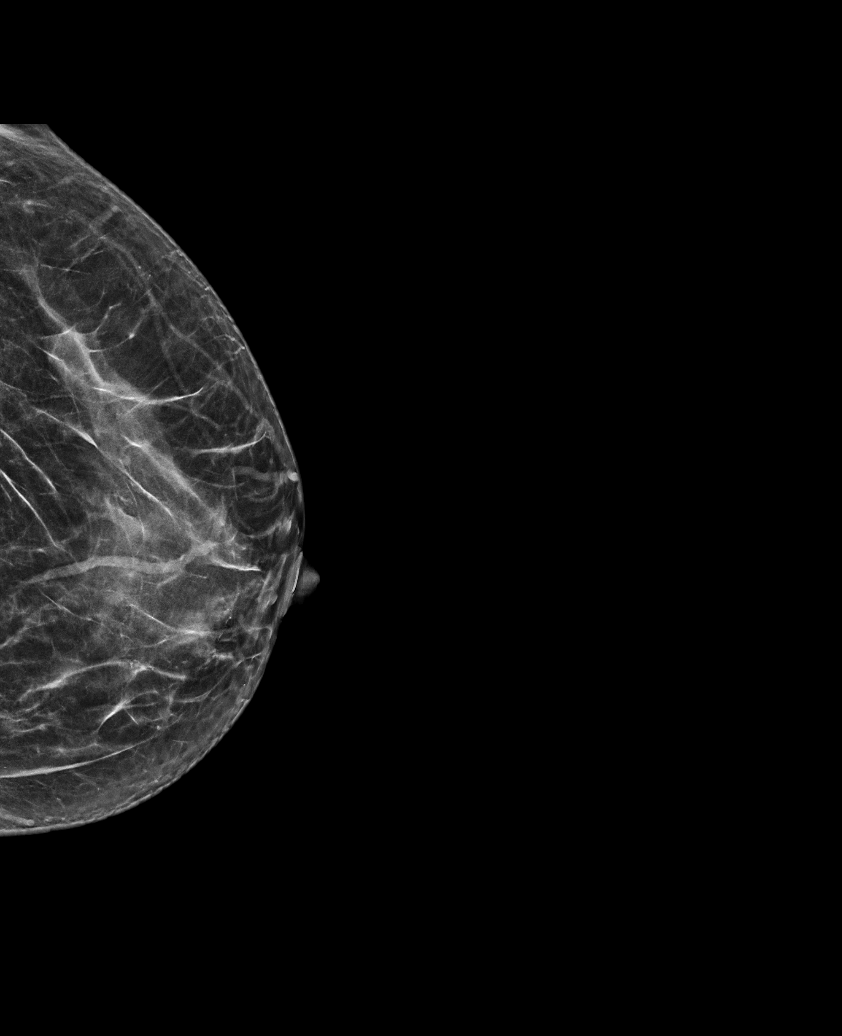

[L TAN tomo · tomo slice 41/81.0]
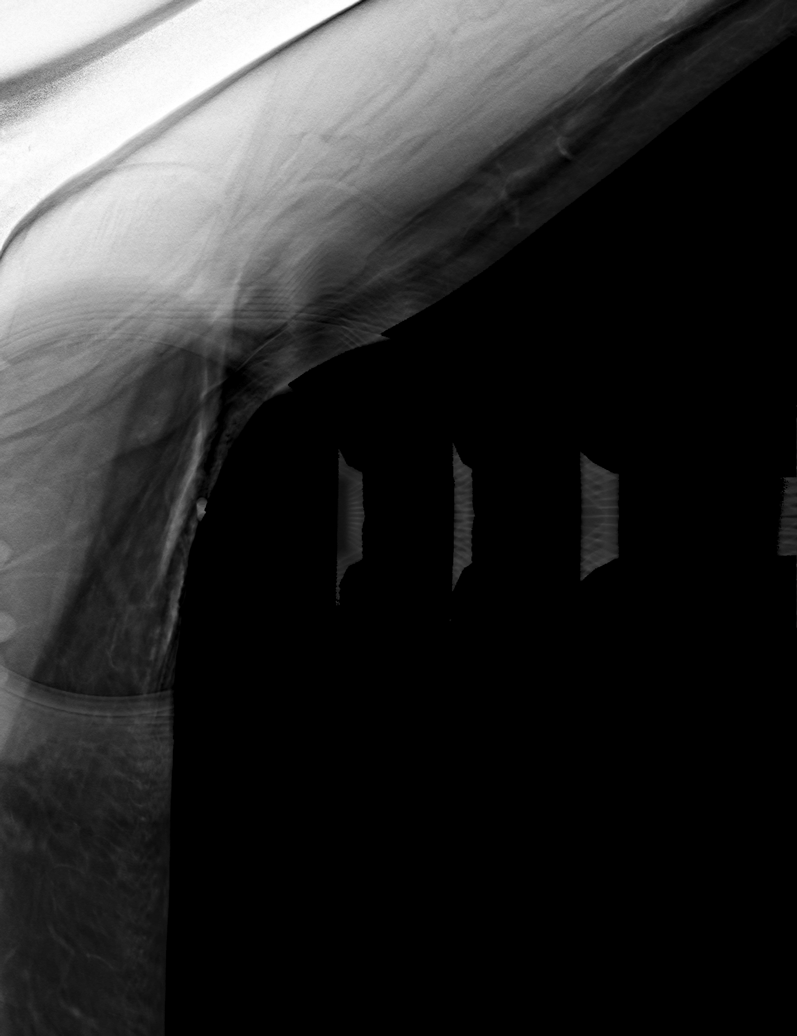

[6 of 30 positions shown; findings below may reference images not displayed]

ACR Breast Density Category c: The breast tissue is heterogeneously
dense, which may obscure small masses.
FINDINGS: Underlying the palpable marker within the left axilla is a small
circumscribed mass with central fatty density, indicative of benign
etiology. No additional concerning masses, calcifications or
distortion identified within either breast.

Mammographic images were processed with CAD.

On physical exam, there is a small palpable mass underlying the skin
within the left axilla.

Targeted ultrasound is performed, showing a 10 x 5 x 8 mm oval
circumscribed hypoechoic mass which appears to be located within the
skin in the left axilla, favored to represent a sebaceous cyst.
IMPRESSION: Palpable mass left axilla favored to represent a benign sebaceous
cyst.

No mammographic evidence for malignancy.

RECOMMENDATION:
Continued clinical evaluation for left axillary palpable
abnormality.

Screening mammogram in one year.(Code:6D-4-UO0)

I have discussed the findings and recommendations with the patient.
Results were also provided in writing at the conclusion of the
visit. If applicable, a reminder letter will be sent to the patient
regarding the next appointment.

BI-RADS CATEGORY  2: Benign.

## 2020-09-16 IMAGING — US US AXILLARY LEFT
1 series · 7 of 7 positions shown · non-contrast
Comparison: None

CLINICAL DATA: Patient presents for palpable abnormality within the
left axilla.

EXAM:
DIGITAL DIAGNOSTIC BILATERAL MAMMOGRAM WITH CAD AND TOMO
ULTRASOUND LEFT BREAST

[Series 1: us axillary left · 0.06mm/px · 7 of 7 slices shown]
[im 1/7]
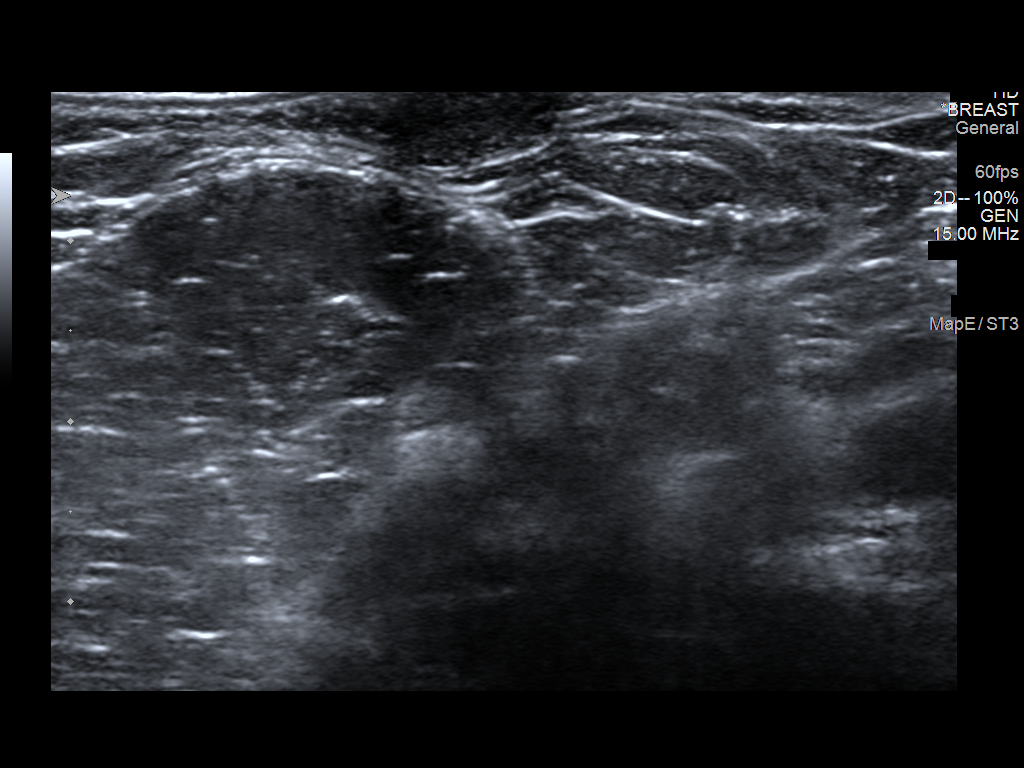
[im 2/7]
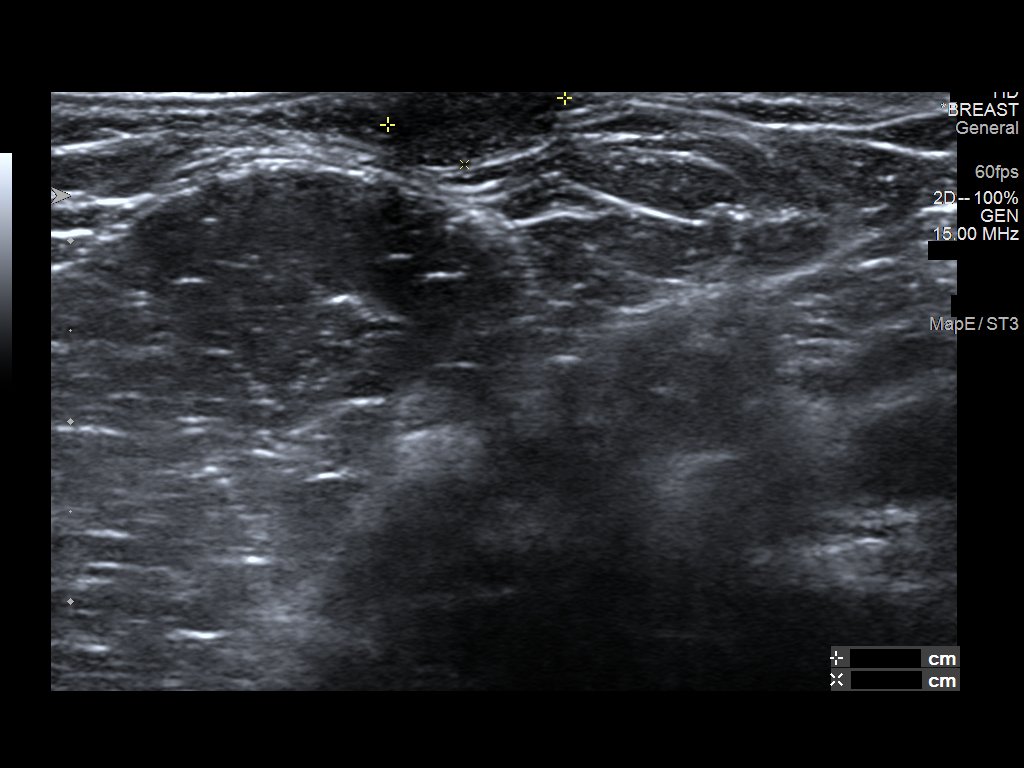
[im 3/7]
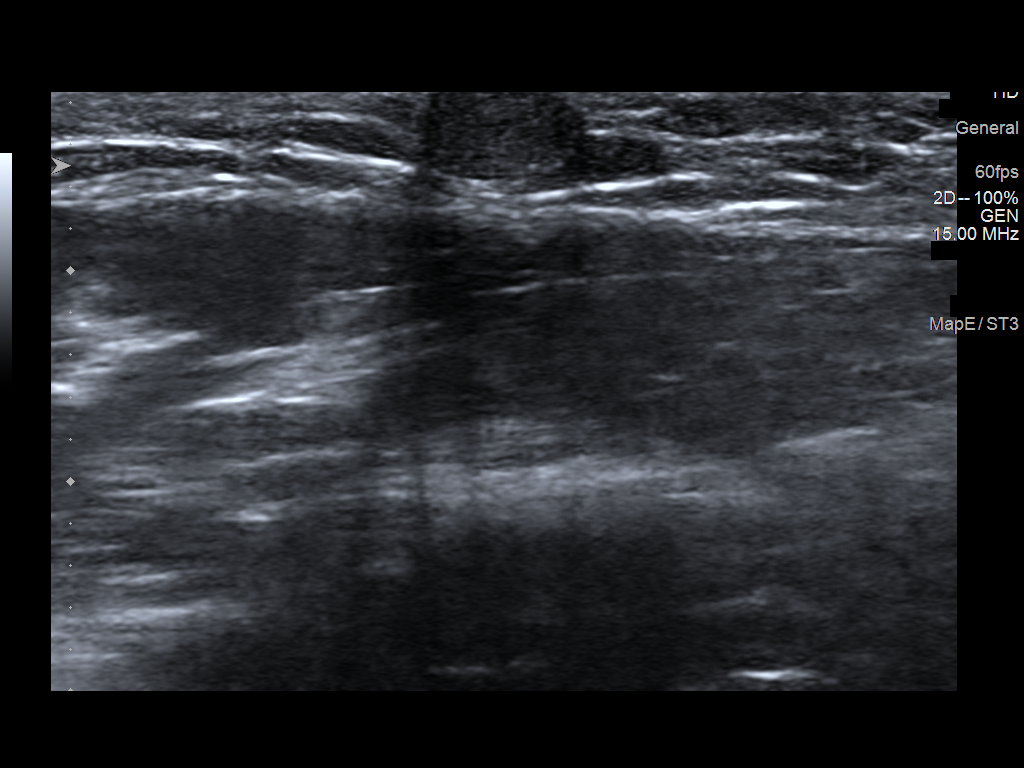
[im 4/7]
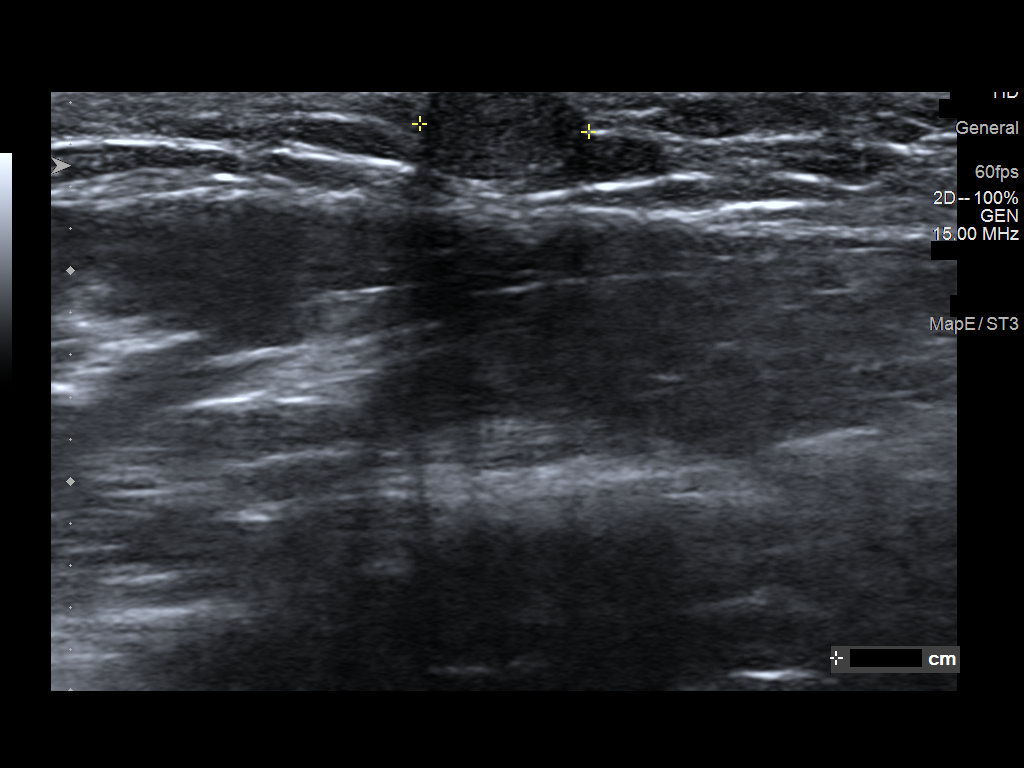
[im 5/7]
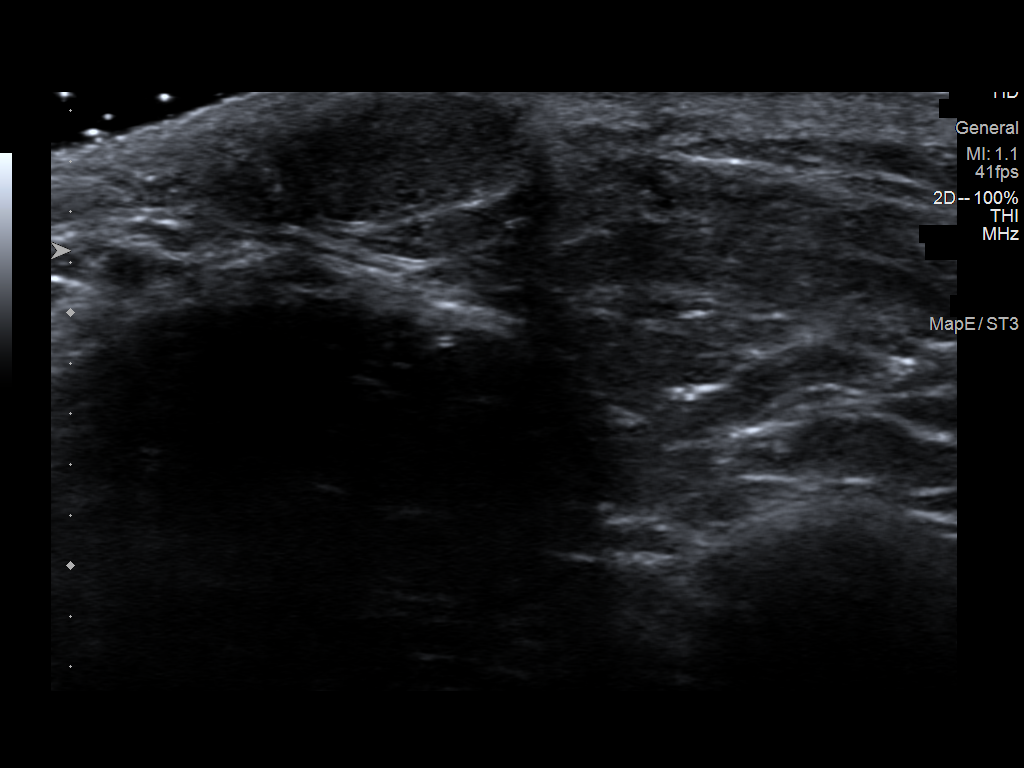
[im 6/7]
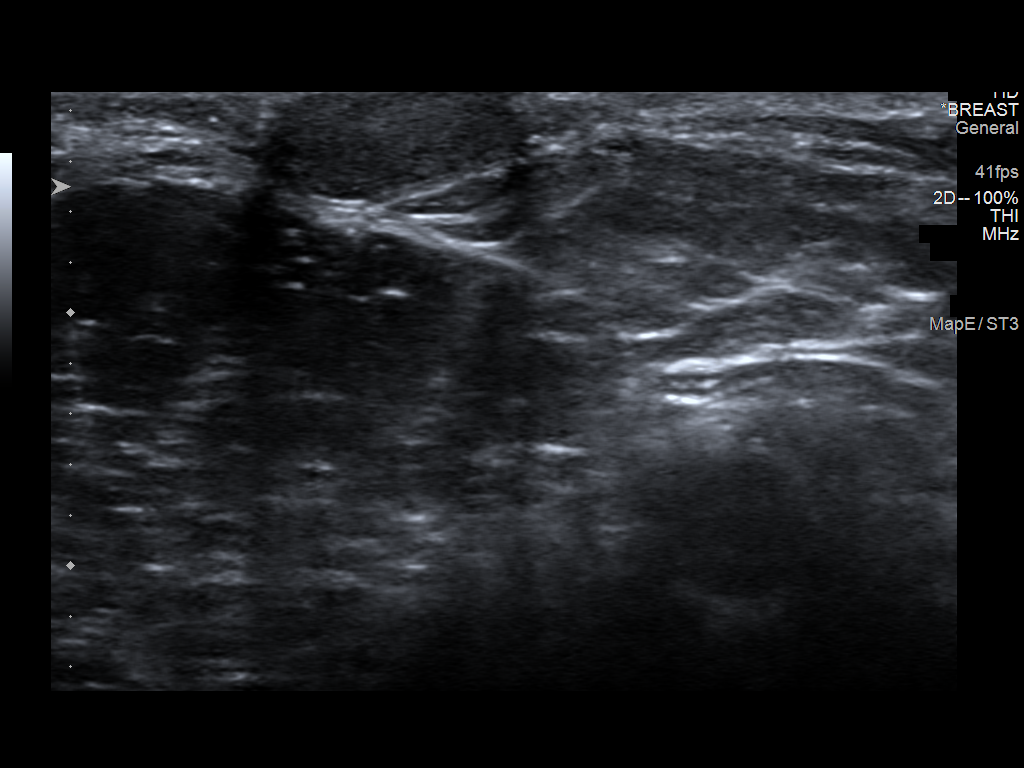
[im 7/7]
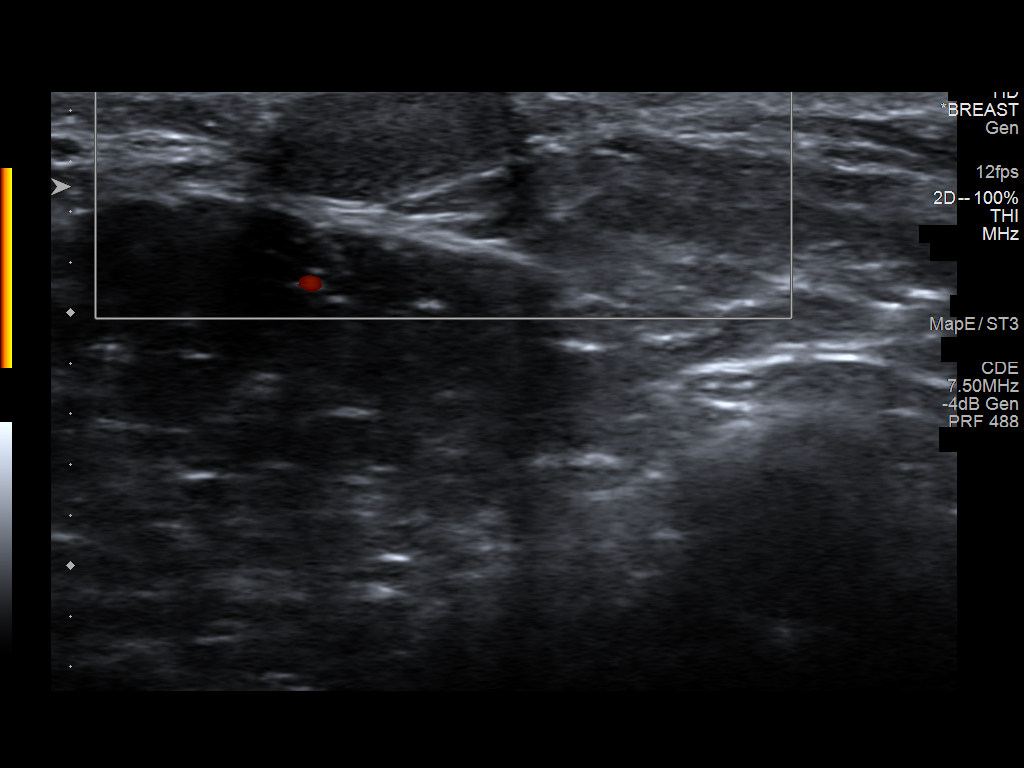

[7 of 7 positions shown; findings below may reference images not displayed]

ACR Breast Density Category c: The breast tissue is heterogeneously
dense, which may obscure small masses.
FINDINGS: Underlying the palpable marker within the left axilla is a small
circumscribed mass with central fatty density, indicative of benign
etiology. No additional concerning masses, calcifications or
distortion identified within either breast.

Mammographic images were processed with CAD.

On physical exam, there is a small palpable mass underlying the skin
within the left axilla.

Targeted ultrasound is performed, showing a 10 x 5 x 8 mm oval
circumscribed hypoechoic mass which appears to be located within the
skin in the left axilla, favored to represent a sebaceous cyst.
IMPRESSION: Palpable mass left axilla favored to represent a benign sebaceous
cyst.

No mammographic evidence for malignancy.

RECOMMENDATION:
Continued clinical evaluation for left axillary palpable
abnormality.

Screening mammogram in one year.(Code:6D-4-UO0)

I have discussed the findings and recommendations with the patient.
Results were also provided in writing at the conclusion of the
visit. If applicable, a reminder letter will be sent to the patient
regarding the next appointment.

BI-RADS CATEGORY  2: Benign.

## 2020-11-01 ENCOUNTER — Encounter: Payer: Self-pay | Admitting: Clinical

## 2020-11-01 ENCOUNTER — Encounter: Payer: Self-pay | Admitting: Internal Medicine

## 2020-11-01 ENCOUNTER — Ambulatory Visit (INDEPENDENT_AMBULATORY_CARE_PROVIDER_SITE_OTHER): Payer: Self-pay | Admitting: Internal Medicine

## 2020-11-01 ENCOUNTER — Other Ambulatory Visit: Payer: Self-pay

## 2020-11-01 VITALS — BP 101/65 | HR 64 | Resp 12 | Ht 62.0 in | Wt 163.0 lb

## 2020-11-01 DIAGNOSIS — Z7689 Persons encountering health services in other specified circumstances: Secondary | ICD-10-CM

## 2020-11-01 DIAGNOSIS — R877 Abnormal histological findings in specimens from female genital organs: Secondary | ICD-10-CM
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
## 2020-11-01 NOTE — Progress Notes (Signed)
Subjective:    Patient ID: Martha Tran, female   DOB: Feb 27, 1976, 45 y.o.   MRN: [id number]   HPI   Here to establish Interpreted by Liz Beach  1.  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  Was unable to follow up yearly as no appts during pandemic per patient.  Last year, went to clinic on [address]. And repeated pap.  Was ultimately told the pap was normal.    2.  Lots of questions about her 62 yo son with thyroid disease.  Sounds like he has hypothyroidism and she is mistaking his diagnosis for hyperthyroidism based on his labs being "high".  Suspect she is talking about TSH level and explained this supports a "low acting thyroid"    No outpatient medications have been marked as taking for the 11/01/20 encounter (Office Visit) with Julieanne Manson, MD.   No Known Allergies   Immunization History  Administered Date(s) Administered  . PFIZER Comirnaty(Gray Top)Covid-19 Tri-Sucrose Vaccine 12/12/2019, 01/02/2020, 10/24/2020  . Tdap 01/06/2013     Past Medical History:  Diagnosis Date  . 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 COLPOSCOPY  06/2018   LGSIL on pathology   Family History  Problem Relation Age of Onset  . Hypertension Sister   . Alcohol abuse Father   . Cancer Father        Hepatocellular CA  . Other Sister        History of polio as child with developmental injury, and function of legs affected.  . Hyperlipidemia Sister   . Hypothyroidism Son     Family Status  Relation Name Status  . Sister  Alive  . Mother  Deceased at age 37       MVA  . Father  Deceased at age 46  . Brother  Alive  . Daughter  Alive, age 29y  . Son  Janalyn Shy, age 28y  . Sister  Alive  . Sister  Alive  . Sister  Alive  . Brother  Alive  . Son  Janalyn Shy, age 4y       Twin  . Son  [place], age 75y       Twin    Social History    Socioeconomic History  . Marital status: Married    Spouse name: Audel  . Number of children: 4  . Years of education: 10  . Highest education level: 10th grade  Occupational History  . Occupation: Housewife  Tobacco Use  . Smoking status: Never Smoker  . Smokeless tobacco: Never Used  Vaping Use  . Vaping Use: Never used  Substance and Sexual Activity  . Alcohol use: No  . Drug use: No  . Sexual activity: Yes    Birth control/protection: I.U.D.  Other Topics Concern  . Not on file  Social History Narrative   Lives at home with husband and daughter plus twin sons   Oldest son lives in [place].   Social Determinants of Health   Financial Resource Strain: Not on file  Food Insecurity: Not on file  Transportation Needs: Not on file  Physical Activity: Not on file  Stress: Not on file  Social Connections: Not on file  Intimate Partner Violence: Not on file   SDOH to be entered by Danton Clap, LCSW   Review of Systems    Objective:   BP 101/65 (  BP Location: Left Arm, Patient Position: Sitting, Cuff Size: Normal)   Pulse 64   Resp 12   Ht 5\' 2"  (1.575 m)   Wt 163 lb (73.9 kg)   Breastfeeding No   BMI 29.81 kg/m   Physical Exam  NAD HEENT:  PERRL, EOMI Neck:  Supple, No adenopathy, no thyromegaly Chest:  CTA CV: RRR with normal S1 and S2, No S3, S4 or murmur.  No carotid bruits.  Carotid, radial and DP pulses normal and equal Abd:  S, NT, No HSM or mass, + BS LE:  No edema.   Assessment & Plan  1.  Establishing  2.  Hx of LGSIL on colposcopy and biopsy 06/2018.  Obtain records from 07/2018. CPE with likely pap in June. Fasting labs prior to visit.  Encouraged obtaining orange card.

## 2021-01-01 ENCOUNTER — Telehealth: Payer: Self-pay | Admitting: Internal Medicine

## 2021-01-01 NOTE — Telephone Encounter (Signed)
Patietn called asking for an appointment. Patient stated that she is been having a dry cough for a month. Patient has taking Mucinex, Delsym and did not help. She also took allergy medicine and is not making any difference either. She continues having dry throat too and would like to be seen. No other symptoms. Please advise.

## 2021-01-07 NOTE — Telephone Encounter (Signed)
Appointment was schedule for 01/16/2021.

## 2021-01-16 ENCOUNTER — Ambulatory Visit: Payer: Self-pay | Admitting: Internal Medicine

## 2021-02-22 ENCOUNTER — Ambulatory Visit: Payer: Self-pay | Admitting: Internal Medicine

## 2021-02-22 ENCOUNTER — Encounter: Payer: Self-pay | Admitting: Internal Medicine

## 2021-02-22 ENCOUNTER — Other Ambulatory Visit: Payer: Self-pay

## 2021-02-22 VITALS — BP 102/68 | HR 78 | Resp 12 | Ht 62.0 in | Wt 166.0 lb

## 2021-02-22 DIAGNOSIS — R5383 Other fatigue: Secondary | ICD-10-CM

## 2021-02-22 NOTE — Progress Notes (Signed)
    Subjective:    Patient ID: Martha Tran, female   DOB: May 30, 1976, 45 y.o.   MRN: [id number]   HPI   Evette Cristal interprets  Fatigue for mainly past 2 months.  Generally a very active person.  No motivation and just sleeps.   No periods since IUD place maybe 2 years ago.   No definite night sweats or hot flashes.  Feels cold all time when everyone else is comfortable.   No cough currently No significant weight gain since last seen. No change to hair or skin texture. No diarrhea, but perhaps mild constipation.  Drinks vinegar for the constipation. No melena or hematochezia.   Apparently, her girlfriends were concerned she might be depressed, but she does not feel she is as no reason to be so.  Taking Herbalife:  Herbal and calcium supplement.  Current Meds  Medication Sig   levonorgestrel (MIRENA) 20 MCG/DAY IUD 1 each by Intrauterine route once. Placed at [facility] maybe in 2020.    No Known Allergies   Review of Systems    Objective:   BP 102/68 (BP Location: Left Arm, Patient Position: Sitting, Cuff Size: Normal)   Pulse 78   Resp 12   Ht 5\' 2"  (1.575 m)   Wt 166 lb (75.3 kg)   BMI 30.36 kg/m   Physical Exam  NAD HEENT:  PERRL, EOMI, TMs pearly gray,, NT over sinuses Neck:  Supple, No adenopathy, no thyromegaly Chest:  CTA CV:  RRR with normal S1 and S2, No S3, S4 or murmur.   Abd:  S, NT, No HSM or mass, + BS Skin:  normal LE:  No edema.  Depression screen [facility] 2/9 02/22/2021 11/01/2020  Decreased Interest 0 0  Down, Depressed, Hopeless 0 0  PHQ - 2 Score 0 0  Altered sleeping 3 0  Tired, decreased energy 3 0  Change in appetite 2 0  Feeling bad or failure about yourself  0 0  Trouble concentrating 0 0  Moving slowly or fidgety/restless 2 0  Suicidal thoughts 0 0  PHQ-9 Score 10 0  Difficult doing work/chores - Not difficult at all      Assessment & Plan   Fatigue:  No findings on exam and not clear as to cause.  PHQ 9 with increase in  score, but really related to the fatigue.  CBC, CMP, TSH.

## 2021-02-23 LAB — CBC WITH DIFFERENTIAL/PLATELET
Basophils Absolute: 0.1 10*3/uL (ref 0.0–0.2)
Basos: 1 %
EOS (ABSOLUTE): 0.8 10*3/uL — ABNORMAL HIGH (ref 0.0–0.4)
Eos: 15 %
Hematocrit: 38.5 % (ref 34.0–46.6)
Hemoglobin: 13 g/dL (ref 11.1–15.9)
Immature Grans (Abs): 0 10*3/uL (ref 0.0–0.1)
Immature Granulocytes: 0 %
Lymphocytes Absolute: 2.1 10*3/uL (ref 0.7–3.1)
Lymphs: 39 %
MCH: 32.3 pg (ref 26.6–33.0)
MCHC: 33.8 g/dL (ref 31.5–35.7)
MCV: 96 fL (ref 79–97)
Monocytes Absolute: 0.4 10*3/uL (ref 0.1–0.9)
Monocytes: 7 %
Neutrophils Absolute: 2 10*3/uL (ref 1.4–7.0)
Neutrophils: 38 %
Platelets: 212 10*3/uL (ref 150–450)
RBC: 4.03 x10E6/uL (ref 3.77–5.28)
RDW: 12.1 % (ref 11.7–15.4)
WBC: 5.3 10*3/uL (ref 3.4–10.8)

## 2021-02-23 LAB — COMPREHENSIVE METABOLIC PANEL
ALT: 21 IU/L (ref 0–32)
AST: 19 IU/L (ref 0–40)
Albumin/Globulin Ratio: 1.6 (ref 1.2–2.2)
Albumin: 4.6 g/dL (ref 3.8–4.8)
Alkaline Phosphatase: 67 IU/L (ref 44–121)
BUN/Creatinine Ratio: 16 (ref 9–23)
BUN: 13 mg/dL (ref 6–24)
Bilirubin Total: 0.5 mg/dL (ref 0.0–1.2)
CO2: 23 mmol/L (ref 20–29)
Calcium: 9.5 mg/dL (ref 8.7–10.2)
Chloride: 99 mmol/L (ref 96–106)
Creatinine, Ser: 0.8 mg/dL (ref 0.57–1.00)
Globulin, Total: 2.8 g/dL (ref 1.5–4.5)
Glucose: 89 mg/dL (ref 65–99)
Potassium: 4.4 mmol/L (ref 3.5–5.2)
Sodium: 139 mmol/L (ref 134–144)
Total Protein: 7.4 g/dL (ref 6.0–8.5)
eGFR: 92 mL/min/{1.73_m2} (ref >59)

## 2021-02-23 LAB — TSH: TSH: 1.09 u[IU]/mL (ref 0.450–4.500)

## 2021-04-08 ENCOUNTER — Other Ambulatory Visit: Payer: Self-pay

## 2021-04-08 DIAGNOSIS — Z13220 Encounter for screening for lipoid disorders: Secondary | ICD-10-CM

## 2021-04-09 LAB — LIPID PANEL W/O CHOL/HDL RATIO
Cholesterol, Total: 165 mg/dL (ref 100–199)
HDL: 61 mg/dL (ref >39)
LDL Chol Calc (NIH): 92 mg/dL (ref 0–99)
Triglycerides: 62 mg/dL (ref 0–149)
VLDL Cholesterol Cal: 12 mg/dL (ref 5–40)

## 2021-04-11 ENCOUNTER — Encounter: Payer: Self-pay | Admitting: Internal Medicine

## 2021-04-11 ENCOUNTER — Ambulatory Visit: Payer: Self-pay | Admitting: Internal Medicine

## 2021-04-11 ENCOUNTER — Other Ambulatory Visit: Payer: Self-pay

## 2021-04-11 VITALS — BP 102/68 | HR 76 | Resp 20 | Ht 62.0 in | Wt 166.0 lb

## 2021-04-11 DIAGNOSIS — Z0000 Encounter for general adult medical examination without abnormal findings: Secondary | ICD-10-CM

## 2021-04-11 DIAGNOSIS — R877 Abnormal histological findings in specimens from female genital organs: Secondary | ICD-10-CM

## 2021-04-11 DIAGNOSIS — Z1231 Encounter for screening mammogram for malignant neoplasm of breast: Secondary | ICD-10-CM

## 2021-04-11 NOTE — Progress Notes (Signed)
Subjective:    Patient ID: Martha Tran, female   DOB: 1976/01/28, 45 y.o.   MRN: [id number]   HPI  CPE without pap  1.  Pap:  Had ASCUS with + HPV in 2019 [facility] for [facility].  LGSIL with colposcopy/biopsy 12.19.  Recommended repeat pap with HPV testing in 1 year.  Had pap with [facility] 07/2020 with negative HPV testing, and pap read as "abnormal", but not clear what it was.  Did sign release for [facility] records.    2.  Mammogram:  Last mammogram in 07/2018 was normal save by findings of benign cyst/sebaceous cyst.  No family history of breast cancer.    3.  Osteoprevention:  Not much in way of dairy or other milk intake daily.  Does eat cheese maybe once a day.  She will try to drink other types of milk for calcium and vitamin D.  Does do zumba or walks perhaps twice weekly.    4.  Guaiac Cards:  Never.    5.  Colonoscopy:  Never.  No family history of colon cancer.    6.  Immunizations:   Immunization History  Administered Date(s) Administered   PFIZER Comirnaty(Gray Top)Covid-19 Tri-Sucrose Vaccine 12/12/2019, 01/02/2020, 10/24/2020   Tdap 01/06/2013     7.  Glucose/Cholesterol :  recently  fine. Lipid Panel     Component Value Date/Time   CHOL 165 04/08/2021 1104   TRIG 62 04/08/2021 1104   HDL 61 04/08/2021 1104   LDLCALC 92 04/08/2021 1104   LABVLDL 12 04/08/2021 1104     Current Meds  Medication Sig   levonorgestrel (MIRENA) 20 MCG/DAY IUD 1 each by Intrauterine route once. Placed at [facility] maybe in 2020.   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   Hyperlipidemia Sister    Hypothyroidism Son    Social History   Socioeconomic History   Marital status: Married    Spouse name: Audel   Number of children: 4   Years of education: 10   Highest education level: 10th grade  Occupational History   Occupation: Housewife  Tobacco Use   Smoking status: Never   Smokeless tobacco: Never  Vaping Use   Vaping Use: Never used  Substance and Sexual Activity   Alcohol use: No   Drug use: No   Sexual activity: Yes    Birth control/protection: I.U.D.  Other Topics Concern   Not on file  Social History Narrative   Lives at home with husband and daughter plus twin sons   Oldest son lives in [place].   Social Determinants of Health   Financial Resource Strain: Low Risk  (11/01/2020)   Overall Financial Resource Strain (CARDIA)    Difficulty of Paying Living Expenses: Not hard at all  Food Insecurity: No Food Insecurity (11/01/2020)   Hunger Vital Sign    Worried About Running Out of Food in the Last Year: Never true    Ran Out of  Food in the Last Year: Never true  Transportation Needs: No Transportation Needs (11/01/2020)   PRAPARE - Administrator, Civil Service (Medical): No    Lack of Transportation (Non-Medical): No  Physical Activity: Not on file  Stress: No Stress Concern Present (11/01/2020)   [employer] of Occupational Health - Occupational Stress Questionnaire    Feeling of Stress : Not at all  Social Connections: Moderately Integrated (11/01/2020)   Social Connection and Isolation Panel [NHANES]    Frequency of Communication with Friends and Family: Once a week    Frequency of Social Gatherings with Friends and Family: More than three times a week    Attends Religious Services: More than 4 times per year    Active Member of [employer] or Organizations: No    Attends Banker Meetings: Never    Marital Status: Married  Catering manager Violence: Not At  Risk (11/01/2020)   Humiliation, Afraid, Rape, and Kick questionnaire    Fear of Current or Ex-Partner: No    Emotionally Abused: No    Physically Abused: No    Sexually Abused: No     Review of Systems  Genitourinary:        Sometimes nighttime hot flashes. Has IUD in place and no periods with that.     Objective:   BP 102/68 (BP Location: Right Arm, Patient Position: Sitting, Cuff Size: Normal)   Pulse 76   Resp 20   Ht 5\' 2"  (1.575 m)   Wt 166 lb (75.3 kg)   BMI 30.36 kg/m   Physical Exam Constitutional:      Appearance: She is obese.  HENT:     Head: Normocephalic and atraumatic.     Right Ear: Tympanic membrane, ear canal and external ear normal.     Left Ear: Tympanic membrane, ear canal and external ear normal.     Nose: Nose normal.     Mouth/Throat:     Mouth: Mucous membranes are moist.     Pharynx: Oropharynx is clear.  Eyes:     Extraocular Movements: Extraocular movements intact.     Conjunctiva/sclera: Conjunctivae normal.     Pupils: Pupils are equal, round, and reactive to light.     Comments: Small nasal pterygium, right eye. Discs sharp  Neck:     Thyroid: No thyroid mass or thyromegaly.  Cardiovascular:     Rate and Rhythm: Normal rate and regular rhythm.     Heart sounds: S1 normal and S2 normal. No murmur heard.    No friction rub. No S3 or S4 sounds.     Comments: No carotid bruits.  Carotid, radial, femoral, DP and PT pulses normal and equal.   Pulmonary:     Effort: Pulmonary effort is normal.     Breath sounds: Normal breath sounds and air entry.  Chest:  Breasts:    Right: No inverted nipple, mass or nipple discharge.     Left: No inverted nipple, mass or nipple discharge.  Abdominal:     General: Bowel sounds are normal.     Palpations: Abdomen is soft. There is no hepatomegaly, splenomegaly or mass.     Tenderness: There is no abdominal tenderness.     Hernia: A hernia (small umbilical hernia, reducible) is present.   Genitourinary:    Comments: Deferred as performed in November at [facility] Musculoskeletal:        General: Normal range of motion.     Cervical back: Normal range of motion  and neck supple.     Right lower leg: No edema.     Left lower leg: No edema.  Lymphadenopathy:     Head:     Right side of head: No submental or submandibular adenopathy.     Left side of head: No submental or submandibular adenopathy.     Cervical: No cervical adenopathy.     Upper Body:     Right upper body: No supraclavicular or axillary adenopathy.     Left upper body: No supraclavicular or axillary adenopathy.     Lower Body: No right inguinal adenopathy. No left inguinal adenopathy.  Skin:    General: Skin is warm.     Capillary Refill: Capillary refill takes less than 2 seconds.     Findings: No rash.  Neurological:     General: No focal deficit present.     Mental Status: She is alert and oriented to person, place, and time.     Cranial Nerves: Cranial nerves 2-12 are intact.     Sensory: Sensation is intact.     Motor: Motor function is intact.     Coordination: Coordination is intact.     Gait: Gait is intact.     Deep Tendon Reflexes: Reflexes are normal and symmetric.  Psychiatric:        Mood and Affect: Mood normal.        Speech: Speech normal.        Behavior: Behavior normal. Behavior is cooperative.     Assessment & Plan   CPE without pap FIT test to return in 1 week Mammogram ordered.  2.  LGSIL in past with + HPV.  Negative HPV in November.  Obtain records from [facility] clinic.

## 2021-04-11 NOTE — Patient Instructions (Addendum)
Call in 1 month if you haven't heard anything about your pap smear at [facility] from November.  Tome un vaso de agua antes de cada comida Tome un minimo de 6 a 8 vasos de agua diarios Coma tres veces al dia Coma una proteina y Neomia Dear grasa saludable con comida.  (huevos, pescado, pollo, pavo, y limite carnes rojas Coma 5 porciones diarias de legumbres.  Mezcle los colores Coma 2 porciones diarias de frutas con cascara cuando sea comestible Use platos pequeos Suelte su tenedor o cuchara despues de cada mordida hata que se mastique y se trague Come en la mesa con amigos o familiares por lo menos una vez al dia Apague la televisin y aparatos electrnicos durante la comida  Su objetivo debe ser perder una libra por semana  Estudios recientes indican que las personas quienes consumen todos de sus calorias durante 12 horas se bajan de pesocon Mas eficiencia.  Por ejemplo, si Usted come su primera comida a las 7:00 a.m., su comida final del dia se debe completar antes de las 7:00 p.m.

## 2021-09-11 ENCOUNTER — Other Ambulatory Visit: Payer: Self-pay | Admitting: Internal Medicine

## 2021-09-11 ENCOUNTER — Ambulatory Visit: Payer: Self-pay | Admitting: Internal Medicine

## 2021-09-11 ENCOUNTER — Other Ambulatory Visit: Payer: Self-pay

## 2021-09-11 ENCOUNTER — Encounter: Payer: Self-pay | Admitting: Internal Medicine

## 2021-09-11 VITALS — BP 102/78 | HR 76 | Resp 12 | Ht 62.0 in | Wt 168.0 lb

## 2021-09-11 DIAGNOSIS — R877 Abnormal histological findings in specimens from female genital organs: Secondary | ICD-10-CM

## 2021-09-11 DIAGNOSIS — Z8619 Personal history of other infectious and parasitic diseases: Secondary | ICD-10-CM

## 2021-09-11 NOTE — Progress Notes (Signed)
° ° °  Subjective:    Patient ID: Martha Tran, female   DOB: Sep 16, 1976, 45 y.o.   MRN: [id number]   HPI   See history of abnormal pap with + HPV in past.  Last pap was in 07/2020 with [facility] and can see her HPV was negative from the check.    2. Hot flashes at night only--then gets cold.     Current Meds  Medication Sig   levonorgestrel (MIRENA) 20 MCG/DAY IUD 1 each by Intrauterine route once. Placed at [facility] maybe in 2020.   No Known Allergies   Review of Systems    Objective:   BP 102/78 (BP Location: Right Arm, Patient Position: Sitting, Cuff Size: Normal)    Pulse 76    Resp 12    Ht 5\' 2"  (1.575 m)    Wt 168 lb (76.2 kg)    BMI 30.73 kg/m   Physical Exam GU:  normal external female genitalia.  Cervix without lesion.  IUD strings intact from cervical os.  No uterine or adnexal mass or tenderness.   Assessment & Plan   History of ASCUS with + HPV, LGSIL on biopsy.  I believe from 2019.  Last pap at [facility], but do not have definitive report, through do see through [facility] that HPV was negative.    Pap with HPV performed today.  2.  Likely perimenopause:  discussed wearing bedclothes that are light and ability to put on or take off more covers.

## 2021-09-16 LAB — CYTOLOGY - PAP

## 2022-09-12 ENCOUNTER — Ambulatory Visit: Payer: Self-pay | Admitting: Internal Medicine

## 2022-09-12 ENCOUNTER — Other Ambulatory Visit: Payer: Self-pay | Admitting: Internal Medicine

## 2022-09-12 ENCOUNTER — Encounter: Payer: Self-pay | Admitting: Internal Medicine

## 2022-09-12 VITALS — BP 110/70 | HR 68 | Resp 16 | Ht 62.0 in | Wt 168.0 lb

## 2022-09-12 DIAGNOSIS — Z0000 Encounter for general adult medical examination without abnormal findings: Secondary | ICD-10-CM

## 2022-09-12 DIAGNOSIS — R5383 Other fatigue: Secondary | ICD-10-CM

## 2022-09-12 DIAGNOSIS — Z23 Encounter for immunization: Secondary | ICD-10-CM

## 2022-09-12 DIAGNOSIS — K429 Umbilical hernia without obstruction or gangrene: Secondary | ICD-10-CM

## 2022-09-12 DIAGNOSIS — J302 Other seasonal allergic rhinitis: Secondary | ICD-10-CM | POA: Insufficient documentation

## 2022-09-12 DIAGNOSIS — Z124 Encounter for screening for malignant neoplasm of cervix: Secondary | ICD-10-CM

## 2022-09-12 DIAGNOSIS — Z1231 Encounter for screening mammogram for malignant neoplasm of breast: Secondary | ICD-10-CM

## 2022-09-12 LAB — POCT WET PREP WITH KOH
KOH Prep POC: NEGATIVE
RBC Wet Prep HPF POC: NEGATIVE
Trichomonas, UA: NEGATIVE
Yeast Wet Prep HPF POC: NEGATIVE

## 2022-09-12 MED ORDER — LORATADINE 10 MG PO TABS: 10.0000 mg | ORAL_TABLET | Freq: Every day | ORAL | 11 refills | Status: DC

## 2022-09-12 MED ORDER — CALCIUM CITRATE-VITAMIN D 250-1.25 MG-MCG PO TABS: ORAL_TABLET | ORAL | Status: DC

## 2022-09-12 NOTE — Patient Instructions (Signed)
[facility] Department [address] [place], [place], [address] [facility] at [phone] .

## 2022-09-12 NOTE — Progress Notes (Signed)
Subjective:    Patient ID: Martha Tran, female   DOB: [birth year], 46 y.o.   MRN: [id number]   HPI  CPE with pap  1.  Pap:  Last 09/11/2021 and normal with [place].  History of ASCUS with + HPV in 2019.  Colposcopy with biopsy showed LGSIL.  Follow up at [facility] 07/2020 with uncertain "abnormal" pap but HPV negative.   2.  Mammogram:  Last 07/2018 and normal--sebaceous cyst in left axilla (dx most favored on mammogram)  3.  Osteoprevention:  Does not like milk of any type.  Willing to take calcium/Vitamin D supplement.  Not physically active.  Walks her dog about 20 minutes.    4.  Guaiac Cards/FIT:  Never--has not returned in past.  5.  Colonoscopy:  Never.  No family history of colon cancer  6.  Immunizations:  Has not had influenza nor COVID boosters. Immunization History  Administered Date(s) Administered   PFIZER Comirnaty(Gray Top)Covid-19 Tri-Sucrose Vaccine 12/12/2019, 01/02/2020, 10/24/2020   Tdap 01/06/2013     7.  Glucose/Cholesterol:  Glucose and cholesterol excellent in 2022.   Lipid Panel     Component Value Date/Time   CHOL 165 04/08/2021 1104   TRIG 62 04/08/2021 1104   HDL 61 04/08/2021 1104   LDLCALC 92 04/08/2021 1104   LABVLDL 12 04/08/2021 1104     Current Meds  Medication Sig   levonorgestrel (MIRENA) 20 MCG/DAY IUD 1 each by Intrauterine route once. Placed at [facility] maybe in 2020.   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   Hyperlipidemia Sister    Hypothyroidism Son     Social History   Socioeconomic History   Marital status: Married    Spouse name: Audel   Number of children: 4   Years of education: 10   Highest education level: 10th grade  Occupational History   Occupation: Housewife  Tobacco Use   Smoking status: Never    Passive exposure: Never   Smokeless tobacco: Never  Vaping Use   Vaping Use: Never used  Substance and Sexual Activity   Alcohol use: No   Drug use: No   Sexual activity: Yes    Birth control/protection: I.U.D.  Other Topics Concern   Not on file  Social History Narrative   Lives at home with husband and daughter plus twin sons   Oldest son lives in [place].   Social Determinants of Health   Financial Resource Strain: Low Risk  (09/12/2022)   Overall Financial Resource Strain (CARDIA)    Difficulty of Paying Living Expenses: Not hard at all  Food Insecurity: No Food Insecurity (11/01/2020)   Hunger Vital Sign    Worried About Running Out of Food in the Last Year: Never true    Ran Out of Food in the Last Year: Never true  Transportation Needs: No Transportation Needs (09/12/2022)   PRAPARE - Administrator, Civil Service (Medical): No  Lack of Transportation (Non-Medical): No  Physical Activity: Not on file  Stress: No Stress Concern Present (11/01/2020)   [employer] of Occupational Health - Occupational Stress Questionnaire    Feeling of Stress : Not at all  Social Connections: Moderately Integrated (11/01/2020)   Social Connection and Isolation Panel [NHANES]    Frequency of Communication with Friends and Family: Once a week    Frequency of Social Gatherings with Friends and Family: More than three times a week    Attends Religious Services: More than 4 times per year    Active Member of [employer] or Organizations: No    Attends Banker Meetings: Never    Marital Status: Married  Catering manager Violence: Not At Risk (09/12/2022)   Humiliation, Afraid, Rape, and Kick questionnaire     Fear of Current or Ex-Partner: No    Emotionally Abused: No    Physically Abused: No    Sexually Abused: No      Review of Systems  Constitutional:  Positive for fatigue (but has not been as active.  No hot flashes or night sweats.).  Respiratory:  Positive for cough (Noted more so when leaves were falling and outside.  Itchy throat and then cough.  Also, in spring.).       Objective:   BP 110/70 (BP Location: Left Arm, Patient Position: Sitting, Cuff Size: Normal)   Pulse 68   Resp 16   Ht 5\' 2"  (1.575 m)   Wt 168 lb (76.2 kg)   BMI 30.73 kg/m   Physical Exam HENT:     Head: Normocephalic and atraumatic.     Right Ear: Tympanic membrane, ear canal and external ear normal.     Left Ear: Tympanic membrane, ear canal and external ear normal.     Nose: Nose normal.     Mouth/Throat:     Mouth: Mucous membranes are moist.     Pharynx: Oropharynx is clear.  Eyes:     Extraocular Movements: Extraocular movements intact.     Conjunctiva/sclera: Conjunctivae normal.     Pupils: Pupils are equal, round, and reactive to light.     Comments: Pterygium at border of nasal iris on left Discs sharp bilaterally  Neck:     Thyroid: No thyroid mass or thyromegaly.  Cardiovascular:     Rate and Rhythm: Normal rate and regular rhythm.     Heart sounds: S1 normal and S2 normal. No murmur heard.    No friction rub. No S3 or S4 sounds.     Comments: No carotid bruits.  Carotid, radial, femoral, DP and PT pulses normal and equal.   Pulmonary:     Effort: Pulmonary effort is normal.     Breath sounds: Normal breath sounds and air entry.  Chest:  Breasts:    Right: No inverted nipple, mass or nipple discharge.     Left: No inverted nipple, mass or nipple discharge.     Comments: Left axilla with 2 cm cystic lesion just beneath skin surface.  Easily moveable.  No overlying erythema.  NT Abdominal:     General: Bowel sounds are normal.     Palpations: Abdomen is soft. There is no  hepatomegaly, splenomegaly or mass.     Tenderness: There is no abdominal tenderness.     Hernia: A hernia (supra-umbilical/umbilical.  Easily reducible.  NT) is present.  Genitourinary:    Comments: Normal external female genitalia No cervical lesion.   IUD strings noted exiting cervical os. No  uterine or adnexal mass or tenderness. Musculoskeletal:        General: Normal range of motion.     Cervical back: Normal range of motion and neck supple.     Right lower leg: No edema.     Left lower leg: No edema.  Lymphadenopathy:     Head:     Right side of head: No submental or submandibular adenopathy.     Left side of head: No submental or submandibular adenopathy.     Cervical: No cervical adenopathy.     Upper Body:     Right upper body: No supraclavicular or axillary adenopathy.     Left upper body: No supraclavicular or axillary adenopathy.     Lower Body: No right inguinal adenopathy. No left inguinal adenopathy.  Skin:    General: Skin is warm.     Capillary Refill: Capillary refill takes less than 2 seconds.     Findings: No rash.  Neurological:     General: No focal deficit present.     Mental Status: She is alert and oriented to person, place, and time.     Cranial Nerves: Cranial nerves 2-12 are intact.     Sensory: Sensation is intact.     Motor: Motor function is intact.     Coordination: Coordination is intact.     Gait: Gait is intact.     Deep Tendon Reflexes: Reflexes are normal and symmetric.  Psychiatric:        Attention and Perception: Attention normal.        Mood and Affect: Mood normal.        Speech: Speech normal.        Behavior: Behavior normal. Behavior is cooperative.      Assessment & Plan    CPE with repeat pap and HPV/2019 history of LGSIL with + HPV.   Normal with negative HPV last year. Mammogram ordered Return for fasting labs:  FLP, CBC, CMP, TSH  2.  Allergies:  Loratadine 10 mg daily  3.  Fatigue and problems now with not being  able to lose weight:  TSH with labs above.  4.  Umbilical hernia:  to ED if not able to reduce and abdominal pain/N & V  5.  Mild BV, but asymptomatic, so no treatment.

## 2022-09-15 LAB — CYTOLOGY - PAP

## 2022-09-16 ENCOUNTER — Other Ambulatory Visit: Payer: Self-pay

## 2022-09-16 DIAGNOSIS — Z0000 Encounter for general adult medical examination without abnormal findings: Secondary | ICD-10-CM

## 2022-09-16 DIAGNOSIS — Z1211 Encounter for screening for malignant neoplasm of colon: Secondary | ICD-10-CM

## 2022-09-16 LAB — POC FIT TEST STOOL: Fecal Occult Blood: NEGATIVE

## 2022-09-16 NOTE — Addendum Note (Signed)
Addended by: Duayne Cal on: 09/16/2022 11:26 AM   Modules accepted: Orders

## 2022-09-17 LAB — COMPREHENSIVE METABOLIC PANEL
ALT: 15 IU/L (ref 0–32)
AST: 18 IU/L (ref 0–40)
Albumin/Globulin Ratio: 1.8 (ref 1.2–2.2)
Albumin: 4.3 g/dL (ref 3.9–4.9)
Alkaline Phosphatase: 69 IU/L (ref 44–121)
BUN/Creatinine Ratio: 13 (ref 9–23)
BUN: 10 mg/dL (ref 6–24)
Bilirubin Total: 0.3 mg/dL (ref 0.0–1.2)
CO2: 26 mmol/L (ref 20–29)
Calcium: 8.9 mg/dL (ref 8.7–10.2)
Chloride: 104 mmol/L (ref 96–106)
Creatinine, Ser: 0.75 mg/dL (ref 0.57–1.00)
Globulin, Total: 2.4 g/dL (ref 1.5–4.5)
Glucose: 85 mg/dL (ref 70–99)
Potassium: 4.2 mmol/L (ref 3.5–5.2)
Sodium: 139 mmol/L (ref 134–144)
Total Protein: 6.7 g/dL (ref 6.0–8.5)
eGFR: 99 mL/min/{1.73_m2} (ref >59)

## 2022-09-17 LAB — CBC WITH DIFFERENTIAL/PLATELET
Basophils Absolute: 0.1 10*3/uL (ref 0.0–0.2)
Basos: 1 %
EOS (ABSOLUTE): 1 10*3/uL — ABNORMAL HIGH (ref 0.0–0.4)
Eos: 20 %
Hematocrit: 37.6 % (ref 34.0–46.6)
Hemoglobin: 12.8 g/dL (ref 11.1–15.9)
Immature Grans (Abs): 0 10*3/uL (ref 0.0–0.1)
Immature Granulocytes: 0 %
Lymphocytes Absolute: 1.8 10*3/uL (ref 0.7–3.1)
Lymphs: 36 %
MCH: 32.6 pg (ref 26.6–33.0)
MCHC: 34 g/dL (ref 31.5–35.7)
MCV: 96 fL (ref 79–97)
Monocytes Absolute: 0.4 10*3/uL (ref 0.1–0.9)
Monocytes: 8 %
Neutrophils Absolute: 1.7 10*3/uL (ref 1.4–7.0)
Neutrophils: 35 %
Platelets: 204 10*3/uL (ref 150–450)
RBC: 3.93 x10E6/uL (ref 3.77–5.28)
RDW: 11.8 % (ref 11.7–15.4)
WBC: 4.9 10*3/uL (ref 3.4–10.8)

## 2022-09-17 LAB — LIPID PANEL W/O CHOL/HDL RATIO
Cholesterol, Total: 155 mg/dL (ref 100–199)
HDL: 61 mg/dL (ref >39)
LDL Chol Calc (NIH): 82 mg/dL (ref 0–99)
Triglycerides: 61 mg/dL (ref 0–149)
VLDL Cholesterol Cal: 12 mg/dL (ref 5–40)

## 2022-09-17 LAB — TSH: TSH: 1.84 u[IU]/mL (ref 0.450–4.500)

## 2023-09-11 ENCOUNTER — Other Ambulatory Visit: Payer: Self-pay | Admitting: Internal Medicine

## 2023-09-15 ENCOUNTER — Other Ambulatory Visit: Payer: Self-pay

## 2023-09-15 DIAGNOSIS — Z0000 Encounter for general adult medical examination without abnormal findings: Secondary | ICD-10-CM

## 2023-09-16 LAB — CBC WITH DIFFERENTIAL/PLATELET
Basophils Absolute: 0.1 10*3/uL (ref 0.0–0.2)
Basos: 2 %
EOS (ABSOLUTE): 1.2 10*3/uL — ABNORMAL HIGH (ref 0.0–0.4)
Eos: 21 %
Hematocrit: 37 % (ref 34.0–46.6)
Hemoglobin: 12.4 g/dL (ref 11.1–15.9)
Immature Grans (Abs): 0 10*3/uL (ref 0.0–0.1)
Immature Granulocytes: 0 %
Lymphocytes Absolute: 1.7 10*3/uL (ref 0.7–3.1)
Lymphs: 31 %
MCH: 32.7 pg (ref 26.6–33.0)
MCHC: 33.5 g/dL (ref 31.5–35.7)
MCV: 98 fL — ABNORMAL HIGH (ref 79–97)
Monocytes Absolute: 0.4 10*3/uL (ref 0.1–0.9)
Monocytes: 7 %
Neutrophils Absolute: 2.2 10*3/uL (ref 1.4–7.0)
Neutrophils: 39 %
Platelets: 198 10*3/uL (ref 150–450)
RBC: 3.79 x10E6/uL (ref 3.77–5.28)
RDW: 12.1 % (ref 11.7–15.4)
WBC: 5.6 10*3/uL (ref 3.4–10.8)

## 2023-09-16 LAB — LIPID PANEL W/O CHOL/HDL RATIO
Cholesterol, Total: 159 mg/dL (ref 100–199)
HDL: 59 mg/dL (ref >39)
LDL Chol Calc (NIH): 86 mg/dL (ref 0–99)
Triglycerides: 70 mg/dL (ref 0–149)
VLDL Cholesterol Cal: 14 mg/dL (ref 5–40)

## 2023-09-16 LAB — COMPREHENSIVE METABOLIC PANEL
ALT: 15 [IU]/L (ref 0–32)
AST: 18 [IU]/L (ref 0–40)
Albumin: 4.1 g/dL (ref 3.9–4.9)
Alkaline Phosphatase: 65 [IU]/L (ref 44–121)
BUN/Creatinine Ratio: 19 (ref 9–23)
BUN: 13 mg/dL (ref 6–24)
Bilirubin Total: 0.3 mg/dL (ref 0.0–1.2)
CO2: 21 mmol/L (ref 20–29)
Calcium: 8.8 mg/dL (ref 8.7–10.2)
Chloride: 104 mmol/L (ref 96–106)
Creatinine, Ser: 0.68 mg/dL (ref 0.57–1.00)
Globulin, Total: 2.2 g/dL (ref 1.5–4.5)
Glucose: 97 mg/dL (ref 70–99)
Potassium: 4 mmol/L (ref 3.5–5.2)
Sodium: 137 mmol/L (ref 134–144)
Total Protein: 6.3 g/dL (ref 6.0–8.5)
eGFR: 107 mL/min/{1.73_m2} (ref >59)

## 2023-09-18 ENCOUNTER — Encounter: Payer: Self-pay | Admitting: Internal Medicine

## 2023-10-22 ENCOUNTER — Encounter: Payer: Self-pay | Admitting: Internal Medicine

## 2023-10-22 ENCOUNTER — Ambulatory Visit: Payer: Self-pay | Admitting: Internal Medicine

## 2023-10-22 VITALS — BP 100/60 | HR 76 | Resp 16 | Ht 62.0 in | Wt 171.0 lb

## 2023-10-22 DIAGNOSIS — Z0000 Encounter for general adult medical examination without abnormal findings: Secondary | ICD-10-CM

## 2023-10-22 DIAGNOSIS — H11001 Unspecified pterygium of right eye: Secondary | ICD-10-CM

## 2023-10-22 DIAGNOSIS — Z23 Encounter for immunization: Secondary | ICD-10-CM

## 2023-10-22 DIAGNOSIS — K429 Umbilical hernia without obstruction or gangrene: Secondary | ICD-10-CM

## 2023-10-22 DIAGNOSIS — Z1231 Encounter for screening mammogram for malignant neoplasm of breast: Secondary | ICD-10-CM

## 2023-10-22 MED ORDER — CALCIUM CITRATE 250 MG PO TABS: ORAL_TABLET | ORAL | Status: AC

## 2023-10-22 MED ORDER — VITAMIN D3 25 MCG (1000 UT) PO CAPS: ORAL_CAPSULE | ORAL | Status: AC

## 2023-10-22 NOTE — Progress Notes (Signed)
Subjective:    Patient ID: Martha Tran, female   DOB: 05/24/1976, 48 y.o.   MRN: [id number]   HPI  CPE without pap  1.  Pap:  Last 08/2022 and normal.  LGSIL in 2019 with colposcopy and biopsy.  Resolved.    2.  Mammogram:  Has not gone for mammogram ordered for past 3 years.  No family history of breast cancer.    3.  Osteoprevention:  Does not eat or drink much in way of dairy daily.    4.  Guaiac Cards/FIT:  Last checked 12.2023 and negative.  5.  Colonoscopy:  Never.  No family history of colon cancer.    6.  Immunizations:  No influenza or COVID vaccine this season.   Immunization History  Administered Date(s) Administered   Moderna Covid-19 Fall Seasonal Vaccine 54yrs & older 09/12/2022   PFIZER Comirnaty(Gray Top)Covid-19 Tri-Sucrose Vaccine 12/12/2019, 01/02/2020, 10/24/2020   Tdap 01/06/2013     7.  Glucose/Cholesterol:  glucose and cholesterol recently fine.   Lipid Panel     Component Value Date/Time   CHOL 159 09/15/2023 0849   TRIG 70 09/15/2023 0849   HDL 59 09/15/2023 0849   LDLCALC 86 09/15/2023 0849   LABVLDL 14 09/15/2023 0849       Current Meds  Medication Sig   Calcium Citrate-Vitamin D 250-1.25 MG-MCG TABS 2 tabletas dos veces al dia   levonorgestrel (MIRENA) 20 MCG/DAY IUD 1 each by Intrauterine route once. Placed at [facility] maybe in 2020.  IUD to be removed this year at [facility]  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   Hyperlipidemia Sister    Hypothyroidism Son     Social History   Socioeconomic History   Marital status: Married    Spouse name: Audel   Number of children: 4   Years of education: 10   Highest education level: 10th grade  Occupational History   Occupation: Housewife  Tobacco Use   Smoking status: Never    Passive exposure: Never   Smokeless tobacco: Never  Vaping Use   Vaping status: Never Used  Substance and Sexual Activity   Alcohol use: No   Drug use: No   Sexual activity: Yes    Birth control/protection: I.U.D.    Comment: To be removed in 2025  Other Topics Concern   Not on file  Social History Narrative   Lives at home with husband and daughter plus twin sons   [place]est [place].   Social Drivers of Corporate investment banker Strain: Low Risk  (10/22/2023)   Overall Financial Resource Strain (CARDIA)    Difficulty of Paying Living Expenses: Not very hard  Food Insecurity: No Food Insecurity (10/22/2023)   Hunger Vital Sign    Worried About Running Out of Food in the Last Year: Never true    Ran Out of Food in the Last Year: Never  true  Transportation Needs: No Transportation Needs (10/22/2023)   PRAPARE - Administrator, Civil Service (Medical): No    Lack of Transportation (Non-Medical): No  Physical Activity: Not on file  Stress: No Stress Concern Present (11/01/2020)   [employer] of Occupational Health - Occupational Stress Questionnaire    Feeling of Stress : Not at all  Social Connections: Moderately Integrated (11/01/2020)   Social Connection and Isolation Panel [NHANES]    Frequency of Communication with Friends and Family: Once a week    Frequency of Social Gatherings with Friends and Family: More than three times a week    Attends Religious Services: More than 4 times per year    Active Member of [employer] or Organizations: No    Attends Banker Meetings: Never    Marital Status: Married  Catering manager Violence: Not At Risk (10/22/2023)    Humiliation, Afraid, Rape, and Kick questionnaire    Fear of Current or Ex-Partner: No    Emotionally Abused: No    Physically Abused: No    Sexually Abused: No      Review of Systems  Eyes:  Negative for visual disturbance (Glasses).  Respiratory:  Negative for shortness of breath.   Cardiovascular:  Negative for chest pain.  Gastrointestinal:  Negative for abdominal pain and blood in stool (No melena).      Objective:   BP 100/60 (BP Location: Left Arm, Patient Position: Sitting, Cuff Size: Normal)   Pulse 76   Resp 16   Ht 5\' 2"  (1.575 m)   Wt 171 lb (77.6 kg)   BMI 31.28 kg/m   Physical Exam Constitutional:      Appearance: She is obese.  HENT:     Head: Normocephalic and atraumatic.     Right Ear: Tympanic membrane, ear canal and external ear normal.     Left Ear: Tympanic membrane, ear canal and external ear normal.     Nose: Nose normal.     Mouth/Throat:     Mouth: Mucous membranes are moist.     Pharynx: Oropharynx is clear.  Eyes:     Extraocular Movements: Extraocular movements intact.     Conjunctiva/sclera: Conjunctivae normal.     Pupils: Pupils are equal, round, and reactive to light.     Comments: Discs sharp Small right nasal pterygium  Neck:     Thyroid: No thyroid mass or thyromegaly.  Cardiovascular:     Rate and Rhythm: Normal rate and regular rhythm.     Heart sounds: S1 normal and S2 normal. No murmur heard.    No friction rub. No S3 or S4 sounds.     Comments: No carotid bruits.  Carotid, radial, femoral, DP and PT pulses normal and equal.   Pulmonary:     Effort: Pulmonary effort is normal.     Breath sounds: Normal breath sounds and air entry.  Chest:  Breasts:    Right: No inverted nipple, mass or nipple discharge.     Left: No inverted nipple, mass or nipple discharge.  Abdominal:     General: Bowel sounds are normal.     Palpations: Abdomen is soft. There is no splenomegaly or mass.     Tenderness: There is no abdominal  tenderness.     Hernia: A hernia (small reducible umbilical hernia) is present.  Genitourinary:    Comments: Declined as will go to [facility] for IUD removal later in year. Musculoskeletal:        General:  Normal range of motion.     Cervical back: Normal range of motion and neck supple.     Right lower leg: No edema.     Left lower leg: No edema.  Lymphadenopathy:     Head:     Right side of head: No submental or submandibular adenopathy.     Left side of head: No submental or submandibular adenopathy.     Cervical: No cervical adenopathy.     Upper Body:     Right upper body: No supraclavicular or axillary adenopathy.     Left upper body: No supraclavicular or axillary adenopathy.     Lower Body: No right inguinal adenopathy. No left inguinal adenopathy.  Skin:    General: Skin is warm.     Findings: No rash.  Neurological:     General: No focal deficit present.     Mental Status: She is alert and oriented to person, place, and time.     Cranial Nerves: Cranial nerves 2-12 are intact.     Sensory: Sensation is intact.     Motor: Motor function is intact.     Coordination: Coordination is intact.     Gait: Gait is intact.     Deep Tendon Reflexes: Reflexes are normal and symmetric.  Psychiatric:        Mood and Affect: Mood normal.        Speech: Speech normal.        Behavior: Behavior normal. Behavior is cooperative.      Assessment & Plan   CPE without pap Calcium citrate 250 mg 2 tabs twice daily and Vitamin D every other day.   Mammogram ordered Influenza vaccine.  2.  Obesity:  work on lifestyle changes  3.  Umbilical hernia:  discussed symptoms of strangulation and to be seen emergently.  No change.  4  Right Pterygium--monitor visual field.

## 2023-11-16 DIAGNOSIS — H11001 Unspecified pterygium of right eye: Secondary | ICD-10-CM | POA: Insufficient documentation

## 2024-10-21 ENCOUNTER — Other Ambulatory Visit (INDEPENDENT_AMBULATORY_CARE_PROVIDER_SITE_OTHER): Payer: Self-pay

## 2024-10-21 DIAGNOSIS — Z0000 Encounter for general adult medical examination without abnormal findings: Secondary | ICD-10-CM

## 2024-10-22 LAB — COMPREHENSIVE METABOLIC PANEL WITH GFR
ALT: 12 [IU]/L (ref 0–32)
AST: 18 [IU]/L (ref 0–40)
Albumin: 4.2 g/dL (ref 3.9–4.9)
Alkaline Phosphatase: 66 [IU]/L (ref 41–116)
BUN/Creatinine Ratio: 15 (ref 9–23)
BUN: 11 mg/dL (ref 6–24)
Bilirubin Total: 0.4 mg/dL (ref 0.0–1.2)
CO2: 22 mmol/L (ref 20–29)
Calcium: 9.1 mg/dL (ref 8.7–10.2)
Chloride: 105 mmol/L (ref 96–106)
Creatinine, Ser: 0.74 mg/dL (ref 0.57–1.00)
Globulin, Total: 2.6 g/dL (ref 1.5–4.5)
Glucose: 96 mg/dL (ref 70–99)
Potassium: 4.4 mmol/L (ref 3.5–5.2)
Sodium: 139 mmol/L (ref 134–144)
Total Protein: 6.8 g/dL (ref 6.0–8.5)
eGFR: 99 mL/min/{1.73_m2}

## 2024-10-22 LAB — CBC WITH DIFFERENTIAL/PLATELET
Basophils Absolute: 0.1 10*3/uL (ref 0.0–0.2)
Basos: 2 %
EOS (ABSOLUTE): 1 10*3/uL — ABNORMAL HIGH (ref 0.0–0.4)
Eos: 22 %
Hematocrit: 38.8 % (ref 34.0–46.6)
Hemoglobin: 12.5 g/dL (ref 11.1–15.9)
Immature Grans (Abs): 0 10*3/uL (ref 0.0–0.1)
Immature Granulocytes: 0 %
Lymphocytes Absolute: 1.8 10*3/uL (ref 0.7–3.1)
Lymphs: 41 %
MCH: 32.1 pg (ref 26.6–33.0)
MCHC: 32.2 g/dL (ref 31.5–35.7)
MCV: 100 fL — ABNORMAL HIGH (ref 79–97)
Monocytes Absolute: 0.3 10*3/uL (ref 0.1–0.9)
Monocytes: 7 %
Neutrophils Absolute: 1.2 10*3/uL — ABNORMAL LOW (ref 1.4–7.0)
Neutrophils: 28 %
Platelets: 199 10*3/uL (ref 150–450)
RBC: 3.9 x10E6/uL (ref 3.77–5.28)
RDW: 12.1 % (ref 11.7–15.4)
WBC: 4.3 10*3/uL (ref 3.4–10.8)

## 2024-10-22 LAB — LIPID PANEL W/O CHOL/HDL RATIO
Cholesterol, Total: 163 mg/dL (ref 100–199)
HDL: 58 mg/dL
LDL Chol Calc (NIH): 92 mg/dL (ref 0–99)
Triglycerides: 65 mg/dL (ref 0–149)
VLDL Cholesterol Cal: 13 mg/dL (ref 5–40)

## 2024-10-27 ENCOUNTER — Encounter: Payer: Self-pay | Admitting: Internal Medicine

## 2024-10-27 ENCOUNTER — Ambulatory Visit: Payer: Self-pay | Admitting: Internal Medicine

## 2024-10-27 VITALS — BP 110/70 | HR 65 | Resp 18 | Ht 62.0 in | Wt 164.0 lb

## 2024-10-27 DIAGNOSIS — K429 Umbilical hernia without obstruction or gangrene: Secondary | ICD-10-CM

## 2024-10-27 DIAGNOSIS — H11002 Unspecified pterygium of left eye: Secondary | ICD-10-CM

## 2024-10-27 DIAGNOSIS — Z1231 Encounter for screening mammogram for malignant neoplasm of breast: Secondary | ICD-10-CM

## 2024-10-27 DIAGNOSIS — Z0000 Encounter for general adult medical examination without abnormal findings: Secondary | ICD-10-CM

## 2024-10-27 DIAGNOSIS — Z742 Need for assistance at home and no other household member able to render care: Secondary | ICD-10-CM

## 2024-10-27 NOTE — Progress Notes (Signed)
 "   Subjective:    Patient ID: Martha Tran, female   DOB: 1976-09-03, 49 y.o.   MRN: [id number]   HPI  CPE without pap  1.  Pap:  Last 08/2022 and normal.  Due beginning of next year.    2.  Mammogram:  Last 07/2018 and normal save for what looked like a sebaceous cyst on the left.  She did not go last time referred to do so.  No family history of breast cancer.    3.  Osteoprevention:  Taking calcium  citrate and vitamin D  regularly. Walks when not cold.    4.  Guaiac Cards/FIT:  Last returned in 2023.    5.  Colonoscopy:  Never.  No family history of colon cancer.  6.  Immunizations:  Has not had influenza nor COVID for this season.  Also, no Pneumococcal or Shingrix yet. Immunization History  Administered Date(s) Administered   Fluzone Influenza virus vaccine,trivalent (IIV3), split virus 10/05/2008   Hep A / Hep B 11/11/2007, 12/02/2007   IPV 03/18/2024   Influenza, Mdck, Trivalent,PF 6+ MOS(egg free) 10/22/2023   MMR 12/02/2007, 03/18/2024   Moderna Covid-19 Fall Seasonal Vaccine 35yrs & older 09/12/2022   Novel Infuenza-h1n1-09 10/05/2008   PFIZER Comirnaty(Gray Top)Covid-19 Tri-Sucrose Vaccine 12/12/2019, 01/02/2020, 10/24/2020   Tdap 12/02/2007, 01/06/2013   Varicella 03/18/2024     7.  Glucose/Cholesterol:  Glucose fasting fine.  Cholesterol at goal.   Lipid Panel     Component Value Date/Time   CHOL 163 10/21/2024 0858   TRIG 65 10/21/2024 0858   HDL 58 10/21/2024 0858   LDLCALC 92 10/21/2024 0858   LABVLDL 13 10/21/2024 0858     Current Meds  Medication Sig   Calcium  Citrate 250 MG TABS 2 tabs by mouth twice daily.   Cholecalciferol (VITAMIN D3) 25 MCG (1000 UT) CAPS 1 cap by mouth every other day.   levonorgestrel (MIRENA) 20 MCG/DAY IUD 1 each by Intrauterine route once. Placed at [facility] in 2020.   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   Hyperlipidemia Sister    Hypothyroidism Son    Social History   Socioeconomic History   Marital status: Married    Spouse name: Audel   Number of children: 4   Years of education: 10   Highest education level: 10th grade  Occupational History   Occupation: Housewife  Tobacco Use   Smoking status: Never    Passive exposure: Never   Smokeless tobacco: Never  Vaping Use   Vaping status: Never Used  Substance and Sexual Activity   Alcohol use: No   Drug use: No   Sexual activity: Yes    Birth control/protection: I.U.D.    Comment: To be removed in 2025  Other Topics Concern   Not on file  Social History Narrative   Lives at home with husband and daughter plus twin sons   [place]est [place]   Social Drivers of Health   Tobacco Use: Low Risk (10/27/2024)   Patient History    Smoking Tobacco Use: Never    Smokeless Tobacco Use: Never    Passive Exposure: Never  Financial Resource Strain: Low Risk (10/27/2024)   Overall Financial Resource Strain (CARDIA)    Difficulty of Paying Living Expenses: Not very hard  Food Insecurity: No Food Insecurity (10/27/2024)   Epic    Worried About Programme Researcher, Broadcasting/film/video in the Last Year: Never true    Ran Out of Food in the Last Year: Never true  Transportation Needs: No Transportation Needs (10/27/2024)   Epic    Lack of Transportation (Medical): No    Lack of Transportation (Non-Medical): No  Physical Activity: Not on file  Stress: Not on file  Social Connections: Not on file  Intimate Partner Violence: Not At Risk (10/27/2024)   Epic    Fear of  Current or Ex-Partner: No    Emotionally Abused: No    Physically Abused: No    Sexually Abused: No  Depression (PHQ2-9): Not on file  Alcohol Screen: Not on file  Housing: Unknown (10/27/2024)   Epic    Unable to Pay for Housing in the Last Year: No    Number of Times Moved in the Last Year: Not on file    Homeless in the Last Year: No  Utilities: Not At Risk (10/27/2024)   Epic    Threatened with loss of utilities: No  Health Literacy: Not on file      Review of Systems  Eyes:  Negative for visual disturbance (glasses).  Respiratory:  Negative for shortness of breath.   Cardiovascular:  Negative for chest pain, palpitations and leg swelling.  Neurological:  Positive for numbness (right fingers go numb with sleep--middle finger first.  Moves her hand and resolve.  Generally worse on night after doing a whole house clean.).      Objective:   BP 110/70 (BP Location: Right Arm, Patient Position: Sitting, Cuff Size: Normal)   Pulse 65   Resp 18   Ht 5' 2 (1.575 m)   Wt 164 lb (74.4 kg)   LMP  (LMP Unknown) Comment: A long time ago--since before 5 years ago when IUD placed  BMI 30.00 kg/m   Physical Exam Constitutional:      Appearance: She is obese.  HENT:     Head: Normocephalic and atraumatic.     Right Ear: Tympanic membrane, ear canal and external ear normal.     Left Ear: Tympanic membrane, ear canal and external ear normal.     Nose: Nose normal.     Mouth/Throat:     Mouth: Mucous membranes are moist.     Pharynx: Oropharynx is clear.  Eyes:     Extraocular Movements: Extraocular movements intact.     Conjunctiva/sclera: Conjunctivae normal.     Pupils: Pupils are equal, round, and reactive to light.     Comments: Nasal pterygium on left not involving iris  Cardiovascular:     Rate and Rhythm: Normal rate and regular rhythm.     Heart sounds: S1 normal and S2 normal. No murmur heard.    No friction rub. No S3 or S4 sounds.     Comments: No carotid bruits.   Carotid, radial, femoral, DP and PT pulses normal and equal.   Pulmonary:     Effort: Pulmonary effort is normal.     Breath sounds: Normal breath sounds and air  entry.  Chest:  Breasts:    Right: No inverted nipple, mass or nipple discharge.     Left: No inverted nipple, mass or nipple discharge.  Abdominal:     General: Bowel sounds are normal.     Palpations: Abdomen is soft. There is no hepatomegaly, splenomegaly or mass.     Hernia: A hernia is present. Hernia is present in the umbilical area (small and reducible).  Genitourinary:    Comments: Declined as will be scheduling with [facility] for IUD removal Musculoskeletal:     Cervical back: Normal range of motion and neck supple.     Right lower leg: No edema.     Left lower leg: No edema.  Lymphadenopathy:     Head:     Right side of head: No submental or submandibular adenopathy.     Left side of head: No submental or submandibular adenopathy.     Cervical: No cervical adenopathy.     Upper Body:     Right upper body: No supraclavicular or axillary adenopathy.     Left upper body: No supraclavicular or axillary adenopathy.     Lower Body: No right inguinal adenopathy. No left inguinal adenopathy.  Skin:    Findings: No rash.  Neurological:     Mental Status: She is alert.     Cranial Nerves: Cranial nerves 2-12 are intact.     Sensory: Sensation is intact.     Motor: Motor function is intact.     Coordination: Coordination is intact.     Gait: Gait is intact.     Deep Tendon Reflexes: Reflexes are normal and symmetric.  Psychiatric:        Mood and Affect: Mood normal.        Speech: Speech normal.        Behavior: Behavior normal. Behavior is cooperative.      Assessment & Plan   CPE without pap Mammogram ordered FIT to return in 2 weeks Schedule return for possible COVID, Pneumococcal 20, and Shingrix Labs all fine Will see if can get support from [employer] to get her signed up for orange card.    2.  Umbilical  hernia:  small and remains easily reducible  3.  Pterygium, left eye.  Sunglasses outdoors  4.  Support for orange card sign up "

## 2024-11-10 ENCOUNTER — Ambulatory Visit: Payer: Self-pay

## 2024-11-28 ENCOUNTER — Ambulatory Visit: Payer: Self-pay

## 2025-10-27 ENCOUNTER — Other Ambulatory Visit: Payer: Self-pay | Admitting: Internal Medicine

## 2025-11-02 ENCOUNTER — Encounter: Payer: Self-pay | Admitting: Internal Medicine
